# Patient Record
Sex: Female | Born: 1955 | Race: White | Hispanic: No | Marital: Single | Smoking: Never smoker
Health system: Southern US, Community
[De-identification: ages and names within clinical notes are randomized; demographics above are authoritative.]

## PROBLEM LIST (undated history)

## (undated) DIAGNOSIS — K219 Gastro-esophageal reflux disease without esophagitis: Secondary | ICD-10-CM

## (undated) DIAGNOSIS — E039 Hypothyroidism, unspecified: Secondary | ICD-10-CM

## (undated) DIAGNOSIS — F419 Anxiety disorder, unspecified: Secondary | ICD-10-CM

## (undated) HISTORY — PX: ABDOMINAL HYSTERECTOMY: SHX81

## (undated) HISTORY — PX: BACK SURGERY: SHX140

## (undated) HISTORY — PX: BIOPSY THYROID: PRO38

## (undated) HISTORY — PX: MENISCECTOMY: SHX123

---

## 2006-08-08 HISTORY — PX: BREAST SURGERY: SHX581

## 2008-11-01 ENCOUNTER — Ambulatory Visit: Payer: Self-pay

## 2009-04-19 ENCOUNTER — Ambulatory Visit: Payer: Self-pay | Admitting: Family Medicine

## 2011-07-06 ENCOUNTER — Ambulatory Visit: Payer: Self-pay

## 2011-07-20 ENCOUNTER — Ambulatory Visit: Payer: Self-pay

## 2011-08-04 ENCOUNTER — Ambulatory Visit: Payer: Self-pay

## 2013-01-15 ENCOUNTER — Ambulatory Visit: Payer: Self-pay | Admitting: Family Medicine

## 2014-05-04 ENCOUNTER — Ambulatory Visit: Payer: Self-pay | Admitting: Physician Assistant

## 2014-05-04 LAB — RAPID STREP-A WITH REFLX: MICRO TEXT REPORT: NEGATIVE

## 2014-05-07 LAB — BETA STREP CULTURE(ARMC)

## 2014-05-09 ENCOUNTER — Ambulatory Visit: Payer: Self-pay | Admitting: Family Medicine

## 2014-08-24 ENCOUNTER — Ambulatory Visit: Payer: Self-pay

## 2014-10-03 ENCOUNTER — Ambulatory Visit: Payer: Self-pay | Admitting: Family Medicine

## 2018-03-07 ENCOUNTER — Ambulatory Visit
Admission: RE | Admit: 2018-03-07 | Discharge: 2018-03-07 | Disposition: A | Discharge status: Home / Self Care | Payer: Managed Care, Other (non HMO) | Source: Ambulatory Visit | Attending: Orthopedic Surgery | Admitting: Orthopedic Surgery

## 2018-03-07 ENCOUNTER — Other Ambulatory Visit: Payer: Self-pay | Admitting: Orthopedic Surgery

## 2018-03-07 DIAGNOSIS — M25561 Pain in right knee: Principal | ICD-10-CM

## 2018-03-07 DIAGNOSIS — G8929 Other chronic pain: Secondary | ICD-10-CM | POA: Insufficient documentation

## 2018-03-28 ENCOUNTER — Encounter: Payer: Self-pay | Admitting: *Deleted

## 2018-03-28 ENCOUNTER — Other Ambulatory Visit: Payer: Self-pay

## 2018-03-28 ENCOUNTER — Encounter
Admission: RE | Admit: 2018-03-28 | Discharge: 2018-03-28 | Disposition: A | Discharge status: Home / Self Care | Payer: Managed Care, Other (non HMO) | Source: Ambulatory Visit | Attending: Orthopedic Surgery | Admitting: Orthopedic Surgery

## 2018-03-28 DIAGNOSIS — R9431 Abnormal electrocardiogram [ECG] [EKG]: Secondary | ICD-10-CM | POA: Insufficient documentation

## 2018-03-28 DIAGNOSIS — Z01812 Encounter for preprocedural laboratory examination: Secondary | ICD-10-CM | POA: Diagnosis present

## 2018-03-28 DIAGNOSIS — Z01810 Encounter for preprocedural cardiovascular examination: Secondary | ICD-10-CM | POA: Diagnosis present

## 2018-03-28 HISTORY — DX: Gastro-esophageal reflux disease without esophagitis: K21.9

## 2018-03-28 HISTORY — DX: Anxiety disorder, unspecified: F41.9

## 2018-03-28 HISTORY — DX: Hypothyroidism, unspecified: E03.9

## 2018-03-28 LAB — TYPE AND SCREEN
ABO/RH(D): O POS
Antibody Screen: NEGATIVE

## 2018-03-28 LAB — BASIC METABOLIC PANEL
ANION GAP: 8 (ref 5–15)
BUN: 19 mg/dL (ref 8–23)
CALCIUM: 9.1 mg/dL (ref 8.9–10.3)
CO2: 30 mmol/L (ref 22–32)
Chloride: 104 mmol/L (ref 98–111)
Creatinine, Ser: 0.91 mg/dL (ref 0.44–1.00)
GFR calc non Af Amer: >60 mL/min (ref >60)
Glucose, Bld: 88 mg/dL (ref 70–99)
Potassium: 3.7 mmol/L (ref 3.5–5.1)
Sodium: 142 mmol/L (ref 135–145)

## 2018-03-28 LAB — CBC
HCT: 43.1 % (ref 35.0–47.0)
Hemoglobin: 14.9 g/dL (ref 12.0–16.0)
MCH: 31.8 pg (ref 26.0–34.0)
MCHC: 34.5 g/dL (ref 32.0–36.0)
MCV: 92.3 fL (ref 80.0–100.0)
Platelets: 232 10*3/uL (ref 150–440)
RBC: 4.68 MIL/uL (ref 3.80–5.20)
RDW: 13.8 % (ref 11.5–14.5)
WBC: 6.5 10*3/uL (ref 3.6–11.0)

## 2018-03-28 LAB — APTT: APTT: 32 s (ref 24–36)

## 2018-03-28 LAB — SEDIMENTATION RATE: Sed Rate: 3 mm/hr (ref 0–30)

## 2018-03-28 LAB — SURGICAL PCR SCREEN
MRSA, PCR: NEGATIVE
Staphylococcus aureus: POSITIVE — AB

## 2018-03-28 LAB — PROTIME-INR
INR: 1.02
PROTHROMBIN TIME: 13.3 s (ref 11.4–15.2)

## 2018-03-28 NOTE — Patient Instructions (Signed)
Your procedure is scheduled on: SEPT 3, 2019 Tuesday  Report to Day Surgery on the 2nd floor of [facility]. To find out your arrival time, please call [phone]) [phone] between 1PM - 3PM on: SEPT 2, 2019 MONDAY  REMEMBER: Instructions that are not followed completely may result in serious medical risk, up to and including death; or upon the discretion of your surgeon and anesthesiologist your surgery may need to be rescheduled.  Do not eat food after midnight the night before surgery.  No gum chewing, lozengers or hard candies.  You may however, drink CLEAR liquids up to 2 hours before you are scheduled to arrive for your surgery. Do not drink anything within 2 hours of the start of your surgery.  Clear liquids include: - water  - apple juice without pulp - gatorade - black coffee or tea (Do NOT add milk or creamers to the coffee or tea) Do NOT drink anything that is not on this list.  No Alcohol for 24 hours before or after surgery.  No Smoking including e-cigarettes for 24 hours prior to surgery.  No chewable tobacco products for at least 6 hours prior to surgery.  No nicotine patches on the day of surgery.  On the morning of surgery brush your teeth with toothpaste and water, you may rinse your mouth with mouthwash if you wish. Do not swallow any toothpaste or mouthwash.  Notify your doctor if there is any change in your medical condition (cold, fever, infection).  Do not wear jewelry, make-up, hairpins, clips or nail polish.  Do not wear lotions, powders, or perfumes. You may wear deodorant.  Do not shave 48 hours prior to surgery. Men may shave face and neck.  Contacts and dentures may not be worn into surgery.  Do not bring valuables to the hospital, including drivers license, insurance or credit cards.  [facility] is not responsible for any belongings or valuables.   TAKE THESE MEDICATIONS THE MORNING OF SURGERY: LEVOTHYROXINE FLUOXETINE NEXIUM TAKE A DOSE THE  NIGHT BEFORE AND THE MORNING OF SURGERY  Use CHG Soap or wipes as directed on instruction sheet.  Stop Anti-inflammatories (NSAIDS) such as Advil, Aleve, Ibuprofen, Motrin, Naproxen, Naprosyn and Aspirin based products such as Excedrin, Goodys Powder, BC Powder 7 DAYS BEFORE SURGERY (May take Tylenol or Acetaminophen if needed.)  Stop ANY OVER THE COUNTER supplements until after surgery. (May continue Vitamin D, Vitamin B, and multivitamin.)  Wear comfortable clothing (specific to your surgery type) to the hospital.  Plan for stool softeners for home use.  If you are being admitted to the hospital overnight, leave your suitcase in the car. After surgery it may be brought to your room.  If you are being discharged the day of surgery, you will not be allowed to drive home. You will need a responsible adult to drive you home and stay with you that night.   If you are taking public transportation, you will need to have a responsible adult with you. Please confirm with your physician that it is acceptable to use public transportation.   Please call [phone] if you have any questions about these instructions.

## 2018-03-29 NOTE — Pre-Procedure Instructions (Signed)
Positive staph aureus results sent to Dr. Rosita KeaMenz for review.  Asked if wanted any treatment &/or antibiotic?

## 2018-03-30 LAB — URINE CULTURE: Culture: <10000 — AB

## 2018-04-02 NOTE — Pre-Procedure Instructions (Signed)
UA RESULTS NOT IN Epic. UC INSIGNIFICANT GROWTH. [facility] UA

## 2018-04-09 MED ORDER — CEFAZOLIN SODIUM-DEXTROSE 2-4 GM/100ML-% IV SOLN
2.0000 g | Freq: Once | INTRAVENOUS | Status: AC
  Administered 2018-04-10: 2 g via INTRAVENOUS

## 2018-04-10 ENCOUNTER — Inpatient Hospital Stay: Payer: Managed Care, Other (non HMO)

## 2018-04-10 ENCOUNTER — Encounter: Payer: Self-pay | Admitting: *Deleted

## 2018-04-10 ENCOUNTER — Inpatient Hospital Stay: Payer: Managed Care, Other (non HMO) | Admitting: Anesthesiology

## 2018-04-10 ENCOUNTER — Other Ambulatory Visit: Payer: Self-pay

## 2018-04-10 ENCOUNTER — Inpatient Hospital Stay
Admission: RE | Admit: 2018-04-10 | Discharge: 2018-04-13 | DRG: 470 | Disposition: A | Discharge status: Skilled Nursing Facility | Payer: Managed Care, Other (non HMO) | Source: Ambulatory Visit | Attending: Orthopedic Surgery | Admitting: Orthopedic Surgery

## 2018-04-10 ENCOUNTER — Encounter
Admission: RE | Disposition: A | Discharge status: Skilled Nursing Facility | Payer: Self-pay | Source: Ambulatory Visit | Attending: Orthopedic Surgery

## 2018-04-10 DIAGNOSIS — F329 Major depressive disorder, single episode, unspecified: Secondary | ICD-10-CM | POA: Diagnosis present

## 2018-04-10 DIAGNOSIS — T391X5A Adverse effect of 4-Aminophenol derivatives, initial encounter: Secondary | ICD-10-CM | POA: Diagnosis not present

## 2018-04-10 DIAGNOSIS — F419 Anxiety disorder, unspecified: Secondary | ICD-10-CM | POA: Diagnosis present

## 2018-04-10 DIAGNOSIS — E039 Hypothyroidism, unspecified: Secondary | ICD-10-CM | POA: Diagnosis present

## 2018-04-10 DIAGNOSIS — R112 Nausea with vomiting, unspecified: Secondary | ICD-10-CM | POA: Diagnosis not present

## 2018-04-10 DIAGNOSIS — M1711 Unilateral primary osteoarthritis, right knee: Principal | ICD-10-CM | POA: Diagnosis present

## 2018-04-10 DIAGNOSIS — K219 Gastro-esophageal reflux disease without esophagitis: Secondary | ICD-10-CM | POA: Diagnosis present

## 2018-04-10 DIAGNOSIS — G8918 Other acute postprocedural pain: Secondary | ICD-10-CM

## 2018-04-10 DIAGNOSIS — Z90711 Acquired absence of uterus with remaining cervical stump: Secondary | ICD-10-CM | POA: Diagnosis not present

## 2018-04-10 DIAGNOSIS — Z96651 Presence of right artificial knee joint: Secondary | ICD-10-CM

## 2018-04-10 HISTORY — PX: TOTAL KNEE ARTHROPLASTY: SHX125

## 2018-04-10 LAB — ABO/RH: ABO/RH(D): O POS

## 2018-04-10 SURGERY — ARTHROPLASTY, KNEE, TOTAL
Anesthesia: Spinal | Laterality: Right

## 2018-04-10 MED ORDER — BUPIVACAINE LIPOSOME 1.3 % IJ SUSP
INTRAMUSCULAR | Status: AC
  Filled 2018-04-10: qty 20

## 2018-04-10 MED ORDER — OXYCODONE HCL 5 MG PO TABS: 5.0000 mg | ORAL_TABLET | Freq: Once | ORAL | Status: DC | PRN

## 2018-04-10 MED ORDER — FENTANYL CITRATE (PF) 100 MCG/2ML IJ SOLN: 25.0000 ug | INTRAMUSCULAR | Status: DC | PRN

## 2018-04-10 MED ORDER — MORPHINE SULFATE 10 MG/ML IJ SOLN
INTRAMUSCULAR | Status: DC | PRN
  Administered 2018-04-10: 10 mg via INTRAVENOUS

## 2018-04-10 MED ORDER — SODIUM CHLORIDE 0.9 % IJ SOLN
INTRAMUSCULAR | Status: AC
  Filled 2018-04-10: qty 100

## 2018-04-10 MED ORDER — METHOCARBAMOL 500 MG PO TABS
500.0000 mg | ORAL_TABLET | Freq: Four times a day (QID) | ORAL | Status: DC | PRN
  Administered 2018-04-10 – 2018-04-11 (×3): 500 mg via ORAL
  Filled 2018-04-10 (×4): qty 1

## 2018-04-10 MED ORDER — SODIUM CHLORIDE 0.9 % IV SOLN
INTRAVENOUS | Status: DC | PRN
  Administered 2018-04-10: 35 ug/min via INTRAVENOUS

## 2018-04-10 MED ORDER — PROPOFOL 10 MG/ML IV BOLUS
INTRAVENOUS | Status: DC | PRN
  Administered 2018-04-10: 40 mg via INTRAVENOUS

## 2018-04-10 MED ORDER — METOCLOPRAMIDE HCL 5 MG/ML IJ SOLN: 5.0000 mg | Freq: Three times a day (TID) | INTRAMUSCULAR | Status: DC | PRN

## 2018-04-10 MED ORDER — METHOCARBAMOL 1000 MG/10ML IJ SOLN
500.0000 mg | Freq: Four times a day (QID) | INTRAVENOUS | Status: DC | PRN
  Filled 2018-04-10: qty 5

## 2018-04-10 MED ORDER — ONDANSETRON HCL 4 MG/2ML IJ SOLN
4.0000 mg | Freq: Four times a day (QID) | INTRAMUSCULAR | Status: DC | PRN
  Administered 2018-04-10: 4 mg via INTRAVENOUS
  Filled 2018-04-10: qty 2

## 2018-04-10 MED ORDER — PROPOFOL 500 MG/50ML IV EMUL
INTRAVENOUS | Status: DC | PRN
  Administered 2018-04-10: 100 ug/kg/min via INTRAVENOUS

## 2018-04-10 MED ORDER — TRANEXAMIC ACID 1000 MG/10ML IV SOLN
1000.0000 mg | INTRAVENOUS | Status: AC
  Administered 2018-04-10: 1000 mg via INTRAVENOUS
  Filled 2018-04-10: qty 1000

## 2018-04-10 MED ORDER — SODIUM CHLORIDE 0.9 % IV SOLN
INTRAVENOUS | Status: DC | PRN
  Administered 2018-04-10: 60 mL

## 2018-04-10 MED ORDER — ACETAMINOPHEN 500 MG PO TABS
500.0000 mg | ORAL_TABLET | Freq: Four times a day (QID) | ORAL | Status: AC
  Administered 2018-04-10 – 2018-04-11 (×4): 500 mg via ORAL
  Filled 2018-04-10 (×4): qty 1

## 2018-04-10 MED ORDER — ASPIRIN EC 325 MG PO TBEC
325.0000 mg | DELAYED_RELEASE_TABLET | Freq: Every day | ORAL | Status: DC
  Administered 2018-04-11 – 2018-04-13 (×3): 325 mg via ORAL
  Filled 2018-04-10 (×3): qty 1

## 2018-04-10 MED ORDER — GABAPENTIN 300 MG PO CAPS
300.0000 mg | ORAL_CAPSULE | Freq: Three times a day (TID) | ORAL | Status: DC
  Administered 2018-04-10 – 2018-04-13 (×9): 300 mg via ORAL
  Filled 2018-04-10 (×10): qty 1

## 2018-04-10 MED ORDER — MAGNESIUM CITRATE PO SOLN
1.0000 | Freq: Once | ORAL | Status: DC | PRN
  Filled 2018-04-10: qty 296

## 2018-04-10 MED ORDER — METOCLOPRAMIDE HCL 10 MG PO TABS: 5.0000 mg | ORAL_TABLET | Freq: Three times a day (TID) | ORAL | Status: DC | PRN

## 2018-04-10 MED ORDER — LACTATED RINGERS IV SOLN
INTRAVENOUS | Status: DC
  Administered 2018-04-10 (×2): via INTRAVENOUS

## 2018-04-10 MED ORDER — NEOMYCIN-POLYMYXIN B GU 40-200000 IR SOLN
Status: DC | PRN
  Administered 2018-04-10: 14 mL

## 2018-04-10 MED ORDER — PROPOFOL 500 MG/50ML IV EMUL
INTRAVENOUS | Status: AC
  Filled 2018-04-10: qty 50

## 2018-04-10 MED ORDER — MIDAZOLAM HCL 2 MG/2ML IJ SOLN
INTRAMUSCULAR | Status: AC
  Filled 2018-04-10: qty 2

## 2018-04-10 MED ORDER — PHENOL 1.4 % MT LIQD
1.0000 | OROMUCOSAL | Status: DC | PRN
  Filled 2018-04-10: qty 177

## 2018-04-10 MED ORDER — MENTHOL 3 MG MT LOZG
1.0000 | LOZENGE | OROMUCOSAL | Status: DC | PRN
  Filled 2018-04-10: qty 9

## 2018-04-10 MED ORDER — BUPIVACAINE-EPINEPHRINE (PF) 0.25% -1:200000 IJ SOLN
INTRAMUSCULAR | Status: DC | PRN
  Administered 2018-04-10: 30 mL via PERINEURAL

## 2018-04-10 MED ORDER — CEFAZOLIN SODIUM-DEXTROSE 2-4 GM/100ML-% IV SOLN
INTRAVENOUS | Status: AC
  Filled 2018-04-10: qty 100

## 2018-04-10 MED ORDER — BUPIVACAINE-EPINEPHRINE (PF) 0.25% -1:200000 IJ SOLN
INTRAMUSCULAR | Status: AC
  Filled 2018-04-10: qty 30

## 2018-04-10 MED ORDER — SODIUM CHLORIDE 0.9 % IV SOLN
INTRAVENOUS | Status: DC
  Administered 2018-04-10 – 2018-04-11 (×2): via INTRAVENOUS

## 2018-04-10 MED ORDER — KETOROLAC TROMETHAMINE 30 MG/ML IJ SOLN
INTRAMUSCULAR | Status: DC | PRN
  Administered 2018-04-10: 30 mg via INTRAVENOUS

## 2018-04-10 MED ORDER — ONDANSETRON HCL 4 MG PO TABS: 4.0000 mg | ORAL_TABLET | Freq: Four times a day (QID) | ORAL | Status: DC | PRN

## 2018-04-10 MED ORDER — HYDROCODONE-ACETAMINOPHEN 7.5-325 MG PO TABS
1.0000 | ORAL_TABLET | ORAL | Status: DC | PRN
  Administered 2018-04-11 (×2): 2 via ORAL
  Filled 2018-04-10 (×2): qty 2

## 2018-04-10 MED ORDER — FLUOXETINE HCL 20 MG PO CAPS
40.0000 mg | ORAL_CAPSULE | Freq: Every day | ORAL | Status: DC
  Administered 2018-04-10 – 2018-04-13 (×4): 40 mg via ORAL
  Filled 2018-04-10 (×4): qty 2

## 2018-04-10 MED ORDER — CEFAZOLIN SODIUM-DEXTROSE 2-4 GM/100ML-% IV SOLN
2.0000 g | Freq: Four times a day (QID) | INTRAVENOUS | Status: AC
  Administered 2018-04-10 (×2): 2 g via INTRAVENOUS
  Filled 2018-04-10 (×3): qty 100

## 2018-04-10 MED ORDER — MIDAZOLAM HCL 5 MG/5ML IJ SOLN
INTRAMUSCULAR | Status: DC | PRN
  Administered 2018-04-10: 2 mg via INTRAVENOUS

## 2018-04-10 MED ORDER — LEVOTHYROXINE SODIUM 25 MCG PO TABS: 25.0000 ug | ORAL_TABLET | ORAL | Status: DC

## 2018-04-10 MED ORDER — PANTOPRAZOLE SODIUM 40 MG PO TBEC
40.0000 mg | DELAYED_RELEASE_TABLET | Freq: Every day | ORAL | Status: DC
  Administered 2018-04-11 – 2018-04-12 (×2): 40 mg via ORAL
  Filled 2018-04-10 (×2): qty 1

## 2018-04-10 MED ORDER — TRAMADOL HCL 50 MG PO TABS
50.0000 mg | ORAL_TABLET | Freq: Four times a day (QID) | ORAL | Status: DC
  Administered 2018-04-10 – 2018-04-13 (×12): 50 mg via ORAL
  Filled 2018-04-10 (×12): qty 1

## 2018-04-10 MED ORDER — LORATADINE 10 MG PO TABS: 10.0000 mg | ORAL_TABLET | Freq: Every day | ORAL | Status: DC | PRN

## 2018-04-10 MED ORDER — SENNOSIDES-DOCUSATE SODIUM 8.6-50 MG PO TABS
1.0000 | ORAL_TABLET | Freq: Every evening | ORAL | Status: DC | PRN
  Administered 2018-04-12: 1 via ORAL
  Filled 2018-04-10: qty 1

## 2018-04-10 MED ORDER — LEVOTHYROXINE SODIUM 50 MCG PO TABS
50.0000 ug | ORAL_TABLET | ORAL | Status: DC
  Administered 2018-04-11 – 2018-04-13 (×3): 50 ug via ORAL
  Filled 2018-04-10 (×3): qty 1

## 2018-04-10 MED ORDER — ACETAMINOPHEN 325 MG PO TABS: 325.0000 mg | ORAL_TABLET | Freq: Four times a day (QID) | ORAL | Status: DC | PRN

## 2018-04-10 MED ORDER — BISACODYL 5 MG PO TBEC
5.0000 mg | DELAYED_RELEASE_TABLET | Freq: Every day | ORAL | Status: DC | PRN
  Administered 2018-04-11: 5 mg via ORAL
  Filled 2018-04-10: qty 1

## 2018-04-10 MED ORDER — ZOLPIDEM TARTRATE 5 MG PO TABS: 5.0000 mg | ORAL_TABLET | Freq: Every evening | ORAL | Status: DC | PRN

## 2018-04-10 MED ORDER — MORPHINE SULFATE (PF) 2 MG/ML IV SOLN
0.5000 mg | INTRAVENOUS | Status: DC | PRN
  Administered 2018-04-10: 1 mg via INTRAVENOUS
  Administered 2018-04-10 (×2): 0.5 mg via INTRAVENOUS
  Administered 2018-04-11 (×2): 1 mg via INTRAVENOUS
  Filled 2018-04-10 (×5): qty 1

## 2018-04-10 MED ORDER — ADULT MULTIVITAMIN W/MINERALS CH
1.0000 | ORAL_TABLET | Freq: Every day | ORAL | Status: DC
  Administered 2018-04-10 – 2018-04-13 (×4): 1 via ORAL
  Filled 2018-04-10 (×4): qty 1

## 2018-04-10 MED ORDER — OXYCODONE HCL 5 MG/5ML PO SOLN: 5.0000 mg | Freq: Once | ORAL | Status: DC | PRN

## 2018-04-10 MED ORDER — ALUM & MAG HYDROXIDE-SIMETH 200-200-20 MG/5ML PO SUSP: 30.0000 mL | ORAL | Status: DC | PRN

## 2018-04-10 MED ORDER — HYDROCODONE-ACETAMINOPHEN 5-325 MG PO TABS
1.0000 | ORAL_TABLET | ORAL | Status: DC | PRN
  Administered 2018-04-10: 2 via ORAL
  Administered 2018-04-11: 1 via ORAL
  Administered 2018-04-11: 2 via ORAL
  Administered 2018-04-12: 1 via ORAL
  Administered 2018-04-12 – 2018-04-13 (×2): 2 via ORAL
  Filled 2018-04-10 (×5): qty 2
  Filled 2018-04-10 (×2): qty 1

## 2018-04-10 MED ORDER — DIPHENHYDRAMINE HCL 12.5 MG/5ML PO ELIX: 12.5000 mg | ORAL_SOLUTION | ORAL | Status: DC | PRN

## 2018-04-10 MED ORDER — BUPIVACAINE HCL (PF) 0.5 % IJ SOLN
INTRAMUSCULAR | Status: DC | PRN
  Administered 2018-04-10: 3 mL

## 2018-04-10 MED ORDER — DOCUSATE SODIUM 100 MG PO CAPS
100.0000 mg | ORAL_CAPSULE | Freq: Two times a day (BID) | ORAL | Status: DC
  Administered 2018-04-10 – 2018-04-12 (×5): 100 mg via ORAL
  Filled 2018-04-10 (×6): qty 1

## 2018-04-10 MED ORDER — MORPHINE SULFATE (PF) 10 MG/ML IV SOLN
INTRAVENOUS | Status: AC
  Filled 2018-04-10: qty 1

## 2018-04-10 MED ORDER — NEOMYCIN-POLYMYXIN B GU 40-200000 IR SOLN
Status: AC
  Filled 2018-04-10: qty 20

## 2018-04-10 SURGICAL SUPPLY — 65 items
BANDAGE ACE 6X5 VEL STRL LF (GAUZE/BANDAGES/DRESSINGS) ×3 IMPLANT
BLADE SAW 1 (BLADE) ×3 IMPLANT
BLOCK CUTTING FEMUR 3 RT MED (MISCELLANEOUS) ×3 IMPLANT
BLOCK CUTTING TIBIAL 4 2 R ×3 IMPLANT
CANISTER SUCT 1200ML W/VALVE (MISCELLANEOUS) ×3 IMPLANT
CANISTER SUCT 3000ML PPV (MISCELLANEOUS) ×6 IMPLANT
CEMENT HV SMART SET (Cement) ×6 IMPLANT
CEMENT PATELLA RESURF SZ1 (Cement) ×3 IMPLANT
CHLORAPREP W/TINT 26ML (MISCELLANEOUS) ×6 IMPLANT
COOLER POLAR GLACIER W/PUMP (MISCELLANEOUS) ×3 IMPLANT
CUFF TOURN 24 STER (MISCELLANEOUS) IMPLANT
CUFF TOURN 30 STER DUAL PORT (MISCELLANEOUS) IMPLANT
DRAPE SHEET LG 3/4 BI-LAMINATE (DRAPES) ×6 IMPLANT
ELECT CAUTERY BLADE 6.4 (BLADE) ×3 IMPLANT
ELECT REM PT RETURN 9FT ADLT (ELECTROSURGICAL) ×3
ELECTRODE REM PT RTRN 9FT ADLT (ELECTROSURGICAL) ×1 IMPLANT
FEMORAL COMPONENT PS R S3N (Femur) ×3 IMPLANT
FEMUR BONE MODEL (MISCELLANEOUS) ×3 IMPLANT
FEMUR BONE MODEL 4.9040 MEDACT (MISCELLANEOUS) ×3 IMPLANT
GAUZE PETRO XEROFOAM 1X8 (MISCELLANEOUS) ×3 IMPLANT
GAUZE SPONGE 4X4 12PLY STRL (GAUZE/BANDAGES/DRESSINGS) ×3 IMPLANT
GLOVE BIOGEL PI IND STRL 9 (GLOVE) ×1 IMPLANT
GLOVE BIOGEL PI INDICATOR 9 (GLOVE) ×2
GLOVE INDICATOR 8.0 STRL GRN (GLOVE) ×3 IMPLANT
GLOVE SURG ORTHO 8.0 STRL STRW (GLOVE) ×3 IMPLANT
GLOVE SURG SYN 9.0  PF PI (GLOVE) ×2
GLOVE SURG SYN 9.0 PF PI (GLOVE) ×1 IMPLANT
GOWN SRG 2XL LVL 4 RGLN SLV (GOWNS) ×1 IMPLANT
GOWN STRL NON-REIN 2XL LVL4 (GOWNS) ×2
GOWN STRL REUS W/ TWL LRG LVL3 (GOWN DISPOSABLE) ×1 IMPLANT
GOWN STRL REUS W/ TWL XL LVL3 (GOWN DISPOSABLE) ×1 IMPLANT
GOWN STRL REUS W/TWL LRG LVL3 (GOWN DISPOSABLE) ×2
GOWN STRL REUS W/TWL XL LVL3 (GOWN DISPOSABLE) ×2
HOLDER FOLEY CATH W/STRAP (MISCELLANEOUS) ×3 IMPLANT
HOOD PEEL AWAY FLYTE STAYCOOL (MISCELLANEOUS) ×6 IMPLANT
KIT TURNOVER KIT A (KITS) ×3 IMPLANT
KNIFE SCULPS 14X20 (INSTRUMENTS) ×3 IMPLANT
NDL SAFETY ECLIPSE 18X1.5 (NEEDLE) ×1 IMPLANT
NEEDLE HYPO 18GX1.5 SHARP (NEEDLE) ×2
NEEDLE SPNL 18GX3.5 QUINCKE PK (NEEDLE) ×3 IMPLANT
NEEDLE SPNL 20GX3.5 QUINCKE YW (NEEDLE) ×3 IMPLANT
NS IRRIG 1000ML POUR BTL (IV SOLUTION) ×3 IMPLANT
PACK TOTAL KNEE (MISCELLANEOUS) ×3 IMPLANT
PAD WRAPON POLAR KNEE (MISCELLANEOUS) ×1 IMPLANT
PREVENA INCISION MGT 90 150 (MISCELLANEOUS) ×3 IMPLANT
PULSAVAC PLUS IRRIG FAN TIP (DISPOSABLE) ×3
SCALPEL PROTECTED #10 DISP (BLADE) ×6 IMPLANT
SOL .9 NS 3000ML IRR  AL (IV SOLUTION) ×2
SOL .9 NS 3000ML IRR UROMATIC (IV SOLUTION) ×1 IMPLANT
STAPLER SKIN PROX 35W (STAPLE) ×3 IMPLANT
STEM EXTENSION 11MMX30MM (Stem) ×3 IMPLANT
SUCTION FRAZIER HANDLE 10FR (MISCELLANEOUS) ×2
SUCTION TUBE FRAZIER 10FR DISP (MISCELLANEOUS) ×1 IMPLANT
SUT DVC 2 QUILL PDO  T11 36X36 (SUTURE) ×2
SUT DVC 2 QUILL PDO T11 36X36 (SUTURE) ×1 IMPLANT
SUT V-LOC 90 ABS DVC 3-0 CL (SUTURE) ×3 IMPLANT
SYR 20CC LL (SYRINGE) ×3 IMPLANT
SYR 50ML LL SCALE MARK (SYRINGE) ×6 IMPLANT
TIB INSERT FIXED SZ2 14MM 0207 (Insert) ×3 IMPLANT
TIB TRAY SZ 2 R FIXED (Joint) ×3 IMPLANT
TIP FAN IRRIG PULSAVAC PLUS (DISPOSABLE) ×1 IMPLANT
TOWEL OR 17X26 4PK STRL BLUE (TOWEL DISPOSABLE) ×3 IMPLANT
TOWER CARTRIDGE SMART MIX (DISPOSABLE) ×3 IMPLANT
TRAY FOLEY MTR SLVR 16FR STAT (SET/KITS/TRAYS/PACK) ×3 IMPLANT
WRAPON POLAR PAD KNEE (MISCELLANEOUS) ×3

## 2018-04-10 NOTE — Progress Notes (Signed)
Spoke with Dr. Sampson Goon about patients spinal progress. Patient has had slight spinal regression, patient's sensory level is between L1-L2, patient able to flex quadriceps muscle. Dr. Sampson Goon said it's okay to go to the floor as long as spinal regression is taking place.

## 2018-04-10 NOTE — Progress Notes (Signed)
Physical Therapy Evaluation Patient Details Name: Caitlin Mendez MRN: [id number] DOB: 11-Nov-1955 Today's Date: 04/10/2018   History of Present Illness  Pt underwent R TKR without reported post-op complications. PMH includes depression, GERD, low B12, and previous R knee surgery. PT evaluation performed on POD#0.  Clinical Impression  Pt admitted with above diagnosis. Pt currently with functional limitations due to the deficits listed below (see PT Problem List).  Pt is modified independent with bed mobility and CGA only for transfers and ambulation. Pt able to take short, shuffling steps to ambulate from bed to recliner. Cues for proper sequencing with rolling walker. Pt is stable with use of walker. AAROM is -3 to 99 degrees and limited in flexion by pain. Pt will benefit from PT services to address deficits in strength, balance, and mobility in order to return to full function at home.      Follow Up Recommendations Home health PT    Equipment Recommendations  Rolling walker with 5" wheels    Recommendations for Other Services       Precautions / Restrictions Precautions Precautions: Knee Required Braces or Orthoses: Knee Immobilizer - Right Knee Immobilizer - Right: Discontinue once straight leg raise with < 10 degree lag Restrictions Weight Bearing Restrictions: Yes RLE Weight Bearing: Weight bearing as tolerated      Mobility  Bed Mobility Overal bed mobility: Modified Independent             General bed mobility comments: Increase in time and effort. Use of bed rails and HOB elevated  Transfers Overall transfer level: Needs assistance Equipment used: Rolling walker (2 wheeled) Transfers: Sit to/from Stand Sit to Stand: Min guard         General transfer comment: Pt able to perform sit to stand transfer with CGA only. Good stability in standing. Pt denies dizziness/lightheadedness. Orthostatic vitals are negative  Ambulation/Gait Ambulation/Gait  assistance: Min guard Gait Distance (Feet): 3 Feet Assistive device: Rolling walker (2 wheeled)       General Gait Details: Pt able to take short, shuffling steps to ambulate from bed to recliner. Cues for proper sequencing with rolling walker. Pt is stable with use of walker  Stairs            Wheelchair Mobility    Modified Rankin (Stroke Patients Only)       Balance Overall balance assessment: Needs assistance Sitting-balance support: No upper extremity supported Sitting balance-Leahy Scale: Good     Standing balance support: No upper extremity supported Standing balance-Leahy Scale: Fair                               Pertinent Vitals/Pain Pain Assessment: 0-10 Pain Score: 4  Pain Location: R knee Pain Descriptors / Indicators: Operative site guarding Pain Intervention(s): Monitored during session;Repositioned    Home Living Family/patient expects to be discharged to:: Private residence Living Arrangements: Alone Available Help at Discharge: Other (Comment)(Pt reports no family/friends available) Type of Home: House Home Access: Stairs to enter Entrance Stairs-Rails: None Entrance Stairs-Number of Steps: 5 Home Layout: One level Home Equipment: Walker - 4 wheels;Cane - single point      Prior Function Level of Independence: Independent with assistive device(s)         Comments: Independent with ADLs/IADLs. Intermittent use of single point cane. No falls     Hand Dominance   Dominant Hand: Right    Extremity/Trunk Assessment   Upper Extremity Assessment  Upper Extremity Assessment: Overall WFL for tasks assessed    Lower Extremity Assessment Lower Extremity Assessment: RLE deficits/detail RLE Deficits / Details: Pt reports mild residual numbness to RLE. Full DF/PF. Full SAQ and SLR without assistance       Communication   Communication: No difficulties  Cognition Arousal/Alertness: Awake/alert Behavior During Therapy: WFL  for tasks assessed/performed Overall Cognitive Status: Within Functional Limits for tasks assessed                                        General Comments      Exercises Total Joint Exercises Ankle Circles/Pumps: Both;10 reps Quad Sets: Both;10 reps Gluteal Sets: Both;10 reps Towel Squeeze: Both;10 reps Short Arc Quad: Right;10 reps Heel Slides: Right;10 reps Hip ABduction/ADduction: Right;10 reps Straight Leg Raises: Right;10 reps Goniometric ROM: -3 to 99 degrees AAROM, pain limited in flexion   Assessment/Plan    PT Assessment Patient needs continued PT services  PT Problem List Decreased strength;Decreased activity tolerance;Decreased balance;Decreased range of motion;Pain       PT Treatment Interventions DME instruction;Gait training;Stair training;Functional mobility training;Therapeutic activities;Therapeutic exercise;Balance training;Patient/family education;Manual techniques    PT Goals (Current goals can be found in the Care Plan section)  Acute Rehab PT Goals Patient Stated Goal: Return to prior level of function PT Goal Formulation: With patient Time For Goal Achievement: 04/24/18 Potential to Achieve Goals: Good    Frequency BID   Barriers to discharge Decreased caregiver support Pt lives alone and report no assistance at discharge    Co-evaluation               AM-PAC PT "6 Clicks" Daily Activity  Outcome Measure Difficulty turning over in bed (including adjusting bedclothes, sheets and blankets)?: A Little Difficulty moving from lying on back to sitting on the side of the bed? : A Little Difficulty sitting down on and standing up from a chair with arms (e.g., wheelchair, bedside commode, etc,.)?: A Little Help needed moving to and from a bed to chair (including a wheelchair)?: A Little Help needed walking in hospital room?: A Little Help needed climbing 3-5 steps with a railing? : A Little 6 Click Score: 18    End of Session  Equipment Utilized During Treatment: Gait belt Activity Tolerance: Patient tolerated treatment well Patient left: in chair;with call bell/phone within reach;with chair alarm set;with SCD's reapplied;Other (comment)(towel roll under heel, polar care in place) Nurse Communication: Mobility status PT Visit Diagnosis: Unsteadiness on feet (R26.81);Muscle weakness (generalized) (M62.81);Pain Pain - Right/Left: Right Pain - part of body: Knee    Time: 0981-1914 PT Time Calculation (min) (ACUTE ONLY): 33 min   Charges:   PT Evaluation $PT Eval Low Complexity: 1 Low PT Treatments $Therapeutic Exercise: 8-22 mins        Sharalyn Ink Devanny Palecek PT, DPT, GCS   Elajah Kunsman 04/10/2018, 5:34 PM

## 2018-04-10 NOTE — H&P (Signed)
Reviewed paper H+P, will be scanned into chart. No changes noted.  

## 2018-04-10 NOTE — H&P (Signed)
Caitlin Mendez, Caitlin Mendez Formatting of this note might be different from the original. Chief Complaint  Patient presents with  . pre op work up  RT TKA09/03/19 [facility]   History of Present Illness: Caitlin Mendez is a 62 y.o. female who is here today for history and physical for right total knee arthroplasty with Dr. Rosita Kea on 04/10/2018. She has had 2 arthroscopy procedures in the past in 2016 in [place]. X-rays show severe degenerative changes in lateral compartment. She is had no relief with hyaluronic acid, cortisone injections as well as physical therapy. Patient is seen Dr. Rosita Kea, and discussed risk benefits complications of total knee replacement and would like to like to proceed with right total knee arthroplasty. Patient's pain is constant, severe and located along the lateral aspect of the knee. She has a hard time walking upstairs. She uses a cane for assistance. She tries to take over-the-counter anti-inflammatory medications for pain. She has taken narcotics in the past for knee pain but does not like taking these medications. Pain is to the point where it interferes with her activities of daily living.  Past Medical History: Past Medical History:  Diagnosis Date  . Allergic rhinitis  . Depressive disorder  . GERD (gastroesophageal reflux disease)  . Low vitamin B12 level  . 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 bx lt mass, reassess 6 months -04/2012: pt declines u/s and endo referral CT neck DDI 09/2010: no pathological left ant cervical LAD, tiny left thyroid nodules  . Right knee meniscal tear 05/07/2015  MRI [facility] 04/2015: complex lateral meniscus with medial meniscal extrusion, cartilage abnormalities worse in patellofemoral compartment, small joint effusion, popliteal cyst   Past Surgical  History: Past Surgical History:  Procedure Laterality Date  . Arthroscopic Partial Lateral Menisectomy Right 10/09/2014  [facility]  . COLONOSCOPY 11/2007  [facility], benign polyps  . knee arthroscopic lateral meniscal debridement Right 12/29/2016  [facility]  . LUMBAR DISC SURGERY 1992  . PARTIAL HYSTERECTOMY Age 45  . Right Breast Core Biopsy 05/2003  benign  . THERAPEUTIC ABORTION   Past Family History: Family History  Problem Relation Age of Onset  . High blood pressure (Hypertension) Mother  . Atrial fibrillation (Abnormal heart rhythm sometimes requiring treatment with blood thinners) Mother  . Benign prostatic hyperplasia Father  . Macular degeneration Father  . OCD Sister  . Scleroderma Sister  . Colon cancer Maternal Grandmother  . Alzheimer's disease Maternal Grandfather  . Stomach cancer Paternal Grandfather  . Lung cancer Maternal Aunt   Medications: Current Outpatient Medications Ordered in Epic  Medication Sig Dispense Refill  . esomeprazole (NEXIUM) 40 MG DR capsule 1 cap by mouth daily 1  . fexofenadine (ALLEGRA) 180 MG tablet Take by mouth Take 180 mg by mouth daily as needed for allergies or rhinitis.  Marland Kitchen FLUoxetine (PROZAC) 40 MG capsule Take 40 mg by mouth once daily.   Marland Kitchen ibuprofen-famotidine 800-26.6 mg Tab Take 1 tablet by mouth.   . levothyroxine sodium (LEVOTHYROXINE ORAL) Take by mouth  . multivitamin tablet Take 1 tablet by mouth once daily.   . multivitamin with iron-minerals (VITAMINS AND MINERALS) tablet Take by mouth Take 1 tablet by mouth daily.  . naproxen sodium (ALEVE, ANAPROX) 220 MG tablet Take by mouth Take 440 mg by mouth 2 (two) times daily as needed (knee pain).  Marland Kitchen  clotrimazole-betamethasone (LOTRISONE) 1-0.05 % cream use daily x 7 days to perineum (Patient not taking: Reported on 04/02/2018 ) 1  . meloxicam (MOBIC) 15 MG tablet Take 15 mg by mouth once daily.   . polyvinyl alcohol (POLYVINYL ALCOHOL) 1.4 % ophthalmic solution   No  current Epic-ordered facility-administered medications on file.   Allergies: Allergies  Allergen Reactions  . Others Hives  Uncoded Allergy. Allergen: DILATIN  . Septra Ds [Sulfamethoxazole-Trimethoprim] Hives  . Phenytoin Sodium Hives  . Sulfa (Sulfonamide Antibiotics) Other (See Comments)  UNKNOWN  . Sulfamethoxazole Hives  . Trimethoprim Hives  . Phenytoin Sodium Extended Rash    Body mass index is 35.94 kg/m.  Review of Systems:  A comprehensive 14 point ROS was performed, reviewed, and the pertinent orthopaedic findings are documented in the HPI.  Physical Exam: General:  Well developed, well nourished, no apparent distress, normal affect, mild antalgic gait.  HEENT: Head normocephalic, atraumatic, PERRL.   Abdomen: Soft, non tender, non distended, Bowel sounds present.  Heart: Examination of the heart reveals regular, rate, and rhythm. There is no murmur noted on ascultation. There is a normal apical pulse.  Lungs: Lungs are clear to auscultation. There is no wheeze, rhonchi, or crackles. There is normal expansion of bilateral chest walls.   Vitals:  04/02/18 1104  BP: 110/70   Orthopaedic Examination: Right Left   Gait Normal  Alignment Valgus deformity of right knee that does not correct  Inspection Normal Normal  Palpation Knee Large Baker's cyst. Normal  Range of Motion Knee 15 degree flexion contracture. 115 degrees of flexion. Normal  Strength Normal Normal  Meniscus Exam Normal Normal  Ligament Exam Normal Normal  Patella Exam Normal Normal  Reflexes Normal Normal  Neurologic Normal Normal  The hip has internal rotation to 50 degrees and external rotation to 40 degrees with no pain.  Imaging Studies: Imaging: AP lateral and sunrise views of the right knee are ordered interpreted by me in the office today. Impression: Patient has severe degenerative changes in the lateral compartment with complete loss of joint space with valgus deformity to the  right knee. Moderate patellofemoral osteoarthritis. No evidence of acute bony normality or effusion.  Assessment:  ICD-10-CM ICD-9-CM  1. Chronic pain of right knee M25.561 719.46  G89.29 338.29  2. Primary osteoarthritis of right knee M17.11 715.16    Plan: 1. Risks, benefits, complications of a right total knee arthroplasty were discussed with the patient. Patient has agreed and consented procedure with Dr. Rosita Kea on 04/10/2017.   Electronically signed by Caitlin Musca, PA at 04/02/2018 11:28 AM Mendez

## 2018-04-10 NOTE — Anesthesia Procedure Notes (Signed)
Date/Time: 04/10/2018 7:41 AM Performed by: Junious Silk, CRNA Pre-anesthesia Checklist: Patient identified, Emergency Drugs available, Suction available, Patient being monitored and Timeout performed Oxygen Delivery Method: Simple face mask

## 2018-04-10 NOTE — Transfer of Care (Signed)
Immediate Anesthesia Transfer of Care Note  Patient: Caitlin Mendez  Procedure(s) Performed: TOTAL KNEE ARTHROPLASTY (Right )  Patient Location: PACU  Anesthesia Type:Spinal  Level of Consciousness: awake, alert  and oriented  Airway & Oxygen Therapy: Patient Spontanous Breathing and Patient connected to face mask oxygen  Post-op Assessment: Report given to RN and Post -op Vital signs reviewed and stable  Post vital signs: Reviewed and stable  Last Vitals:  Vitals Value Taken Time  BP    Temp    Pulse    Resp    SpO2      Last Pain:  Vitals:   04/10/18 0617  TempSrc: Oral  PainSc: 6          Complications: No apparent anesthesia complications

## 2018-04-10 NOTE — Progress Notes (Signed)
Pt. admitted to unit, room 140 from PACU. Oriented to room, call bell, Ascom phones and staff. Bed in low position. Fall safety plan reviewed, non-skid socks in place, bed alarm on. Full assessment to Epic; skin assessed with Irving Burton, RN. Surgical dressing clean and dry. Bone foam and polar care on. Wound Vac in place.  Family at bedside. Will continue to monitor.

## 2018-04-10 NOTE — Anesthesia Procedure Notes (Addendum)
Spinal  Patient location during procedure: OR Start time: 04/10/2018 7:20 AM End time: 04/10/2018 7:25 AM Staffing Resident/CRNA: Nelda Marseille, CRNA Performed: resident/CRNA  Preanesthetic Checklist Completed: patient identified, site marked, surgical consent, pre-op evaluation, timeout performed, IV checked, risks and benefits discussed and monitors and equipment checked Spinal Block Patient position: sitting Prep: Betadine Patient monitoring: heart rate, continuous pulse ox, blood pressure and cardiac monitor Approach: midline Location: L4-5 Injection technique: single-shot Needle Needle type: Whitacre and Introducer  Needle gauge: 25 G Needle length: 9 cm Additional Notes Negative paresthesia. Negative blood return. Positive free-flowing CSF. Expiration date of kit checked and confirmed. Patient tolerated procedure well, without complications.

## 2018-04-10 NOTE — Op Note (Signed)
04/10/2018  9:19 AM  PATIENT:  Caitlin Mendez  62 y.o. female  PRE-OPERATIVE DIAGNOSIS:  PRIMARY OSTEOARTHRITIS OF RIGHT KNEE  POST-OPERATIVE DIAGNOSIS:  PRIMARY OSTEOARTHRITIS OF RIGHT KNEE  PROCEDURE:  Procedure(s): TOTAL KNEE ARTHROPLASTY (Right)  SURGEON: Leitha Schuller, MD  ASSISTANTS: Cranston Neighbor, PA-C  ANESTHESIA:   spinal  EBL:  Total I/O In: 1000 [I.V.:1000] Out: 550 [Urine:500; Blood:50]  BLOOD ADMINISTERED:none  DRAINS: none   LOCAL MEDICATIONS USED:  MARCAINE    and OTHER Exparel morphine and Toradol  SPECIMEN:  No Specimen  DISPOSITION OF SPECIMEN:  N/A  COUNTS:  YES  TOURNIQUET:  * Missing tourniquet times found for documented tourniquets in log: [id number] *  IMPLANTS: Medacta GMK 3 and right femoral PS component, 2 right tibial component with short stem 14 mm PS insert, 1 patella, all components cemented  DICTATION: .Dragon Dictation  patient was brought to the operating room and after adequate anesthesia was obtainedrightlegwas prepped and draped in thesterile fashion with tourniquet applied to the  rightupper thigh. After patient identification and timeout procedure was completed thetourniquet was raised and midline skin incision was made followed by medial parapatellar arthrotomy. Inspection revealed significant patellofemoral degenerative change of lateral compartment, there was exposed bone and eburnation of the lateral compartment both femoral and tibial condyleswith moderate mild medial compartmentosteoarthritis. There is also synovitis present within the joint which was excised. The ACL, PCL and fat pad were excised as well. The proximal tibia was exposed and themy knee cutting guide was applied proximal tibia cut carried out.Distal femoral cutting guide applied in a similar fashion and distal femoral cut carried outThe femur was then prepared withsize3cutting guide withanterior posterior and chamfer cuts made with appropriate  size cuts and this appeared to fit well the femoraltrial was placed distal drill holes made followed by the trochlear groove cut, as well as box cut for the PS implant. The tibia was prepared using the tibial template baseplate size 2 this was pinned into position proximal tibial preparation carried out for short stem. A teammm insert gave excellent stability and full extension. The trials were removed at this point and the bony surfaces thoroughly irrigated and dried after the patellar cutting been carried out with the patellar cutting guide drill holes made and sized to a size 1. The above local was then infiltrated in the periarticular tissues and the bony surfaces dried the tibial component was cemented to place first with excess cement removed followed by the polyethylene component with the set screw and a torque screwdriver. The femoral component was placed in the knee held in extension as the cement set with the patellar button clamped into place. After the cement hadset,excess cement was removed and the knee was thoroughly again thoroughly irrigated with tourniquet let down. A lateral release was performed because of some tendency for the patella to want to track laterally. The arthrotomy was then repaired using heavy Manson Allan,.3-0 v-loccuticular followed by skin staples.Wound was then covered with incisional wound VAC.   PLAN OF CARE: Admit to inpatient   PATIENT DISPOSITION:  PACU - hemodynamically stable.

## 2018-04-10 NOTE — [place] FL2 (Signed)
[place] MEDICAID FL2 LEVEL OF CARE SCREENING TOOL     IDENTIFICATION  Patient Name: Caitlin Mendez Birthdate: 11-30-1955 Sex: female Admission Date (Current Location): 04/10/2018  [place] Number:  Chiropodist and Address:  [facility], [address] [place], [place] [address]      Provider Number: [id number]  Attending Physician Name and Address:  Kennedy Bucker, MD  Relative Name and Phone Number:       Current Level of Care: Hospital Recommended Level of Care: Skilled Nursing Facility Prior Approval Number:    Date Approved/Denied:   PASRR Number: ([id number])  Discharge Plan: SNF    Current Diagnoses: Patient Active Problem List   Diagnosis Date Noted  . Status post total knee replacement using cement, right 04/10/2018    Orientation RESPIRATION BLADDER Height & Weight     Self, Time, Situation, Place  Normal Continent Weight: 212 lb 1.3 oz (96.2 kg) Height:  5\' 4"  (162.6 cm)  BEHAVIORAL SYMPTOMS/MOOD NEUROLOGICAL BOWEL NUTRITION STATUS      Continent Diet(Diet: Regular )  AMBULATORY STATUS COMMUNICATION OF NEEDS Skin   Extensive Assist Verbally Surgical wounds, Wound Vac(Incision: Right Knee. Provena Wound Vac. )                       Personal Care Assistance Level of Assistance  Bathing, Feeding, Dressing Bathing Assistance: Limited assistance Feeding assistance: Independent Dressing Assistance: Limited assistance     Functional Limitations Info  Sight, Hearing, Speech Sight Info: Adequate Hearing Info: Adequate Speech Info: Adequate    SPECIAL CARE FACTORS FREQUENCY  PT (By licensed PT), OT (By licensed OT)     PT Frequency: (5) OT Frequency: (5)            Contractures      Additional Factors Info  Code Status, Allergies Code Status Info: (Full Code. ) Allergies Info: (Sulfamethoxazole-trimethoprim, Phenytoin Sodium)           Current Medications (04/10/2018):  This is the  current hospital active medication list Current Facility-Administered Medications  Medication Dose Route Frequency Provider Last Rate Last Dose  . 0.9 %  sodium chloride infusion   Intravenous Continuous Kennedy Bucker, MD 75 mL/hr at 04/10/18 1500    . [START ON 04/11/2018] acetaminophen (TYLENOL) tablet 325-650 mg  325-650 mg Oral Q6H PRN Kennedy Bucker, MD      . acetaminophen (TYLENOL) tablet 500 mg  500 mg Oral Q6H Kennedy Bucker, MD   500 mg at 04/10/18 1211  . alum & mag hydroxide-simeth (MAALOX/MYLANTA) 200-200-20 MG/5ML suspension 30 mL  30 mL Oral Q4H PRN Kennedy Bucker, MD      . Melene Muller ON 04/11/2018] aspirin EC tablet 325 mg  325 mg Oral Q breakfast Kennedy Bucker, MD      . bisacodyl (DULCOLAX) EC tablet 5 mg  5 mg Oral Daily PRN Kennedy Bucker, MD      . ceFAZolin (ANCEF) IVPB 2g/100 mL premix  2 g Intravenous Q6H Kennedy Bucker, MD 200 mL/hr at 04/10/18 1615 2 g at 04/10/18 1615  . diphenhydrAMINE (BENADRYL) 12.5 MG/5ML elixir 12.5-25 mg  12.5-25 mg Oral Q4H PRN Kennedy Bucker, MD      . docusate sodium (COLACE) capsule 100 mg  100 mg Oral BID Kennedy Bucker, MD      . FLUoxetine (PROZAC) capsule 40 mg  40 mg Oral Daily Kennedy Bucker, MD   40 mg at 04/10/18 1217  . gabapentin (NEURONTIN) capsule 300  mg  300 mg Oral TID Kennedy Bucker, MD   300 mg at 04/10/18 1612  . HYDROcodone-acetaminophen (NORCO) 7.5-325 MG per tablet 1-2 tablet  1-2 tablet Oral Q4H PRN Kennedy Bucker, MD      . HYDROcodone-acetaminophen (NORCO/VICODIN) 5-325 MG per tablet 1-2 tablet  1-2 tablet Oral Q4H PRN Kennedy Bucker, MD   2 tablet at 04/10/18 1612  . [START ON 04/11/2018] levothyroxine (SYNTHROID, LEVOTHROID) tablet 50 mcg  50 mcg Oral Henrene Hawking, Casimiro Needle, MD      . loratadine (CLARITIN) tablet 10 mg  10 mg Oral Daily PRN Kennedy Bucker, MD      . magnesium citrate solution 1 Bottle  1 Bottle Oral Once PRN Kennedy Bucker, MD      . menthol-cetylpyridinium (CEPACOL) lozenge 3 mg  1 lozenge Oral PRN Kennedy Bucker, MD       Or   . phenol (CHLORASEPTIC) mouth spray 1 spray  1 spray Mouth/Throat PRN Kennedy Bucker, MD      . methocarbamol (ROBAXIN) tablet 500 mg  500 mg Oral Q6H PRN Kennedy Bucker, MD       Or  . methocarbamol (ROBAXIN) 500 mg in dextrose 5 % 50 mL IVPB  500 mg Intravenous Q6H PRN Kennedy Bucker, MD      . metoCLOPramide (REGLAN) tablet 5-10 mg  5-10 mg Oral Q8H PRN Kennedy Bucker, MD       Or  . metoCLOPramide (REGLAN) injection 5-10 mg  5-10 mg Intravenous Q8H PRN Kennedy Bucker, MD      . morphine 2 MG/ML injection 0.5-1 mg  0.5-1 mg Intravenous Q2H PRN Kennedy Bucker, MD      . multivitamin with minerals tablet 1 tablet  1 tablet Oral Daily Kennedy Bucker, MD   1 tablet at 04/10/18 1217  . ondansetron (ZOFRAN) tablet 4 mg  4 mg Oral Q6H PRN Kennedy Bucker, MD       Or  . ondansetron [facility]) injection 4 mg  4 mg Intravenous Q6H PRN Kennedy Bucker, MD      . Melene Muller ON 04/11/2018] pantoprazole (PROTONIX) EC tablet 40 mg  40 mg Oral Q1200 Kennedy Bucker, MD      . senna-docusate (Senokot-S) tablet 1 tablet  1 tablet Oral QHS PRN Kennedy Bucker, MD      . traMADol Janean Sark) tablet 50 mg  50 mg Oral Q6H Kennedy Bucker, MD   50 mg at 04/10/18 1211  . zolpidem (AMBIEN) tablet 5 mg  5 mg Oral QHS PRN Kennedy Bucker, MD         Discharge Medications: Please see discharge summary for a list of discharge medications.  Relevant Imaging Results:  Relevant Lab Results:   Additional Information (SSN: [id number])  [name], Darleen Crocker, LCSW

## 2018-04-10 NOTE — Progress Notes (Signed)
IS education complete, pt understands technique and reason for use. Pt inspire 1750-222ml. Pt independent with use

## 2018-04-10 NOTE — Anesthesia Postprocedure Evaluation (Signed)
Anesthesia Post Note  Patient: Caitlin Mendez  Procedure(s) Performed: TOTAL KNEE ARTHROPLASTY (Right )  Patient location during evaluation: PACU Anesthesia Type: Spinal Level of consciousness: awake and alert Pain management: pain level controlled Vital Signs Assessment: post-procedure vital signs reviewed and stable Respiratory status: spontaneous breathing, nonlabored ventilation, respiratory function stable and patient connected to nasal cannula oxygen Cardiovascular status: blood pressure returned to baseline and stable Postop Assessment: no apparent nausea or vomiting Anesthetic complications: no     Last Vitals:  Vitals:   04/10/18 1339 04/10/18 1439  BP: 130/65 105/89  Pulse: 67 61  Resp:    Temp: 36.5 C 36.5 C  SpO2: 100% 99%    Last Pain:  Vitals:   04/10/18 1439  TempSrc: Oral  PainSc:                  Jovita Gamma

## 2018-04-10 NOTE — Anesthesia Preprocedure Evaluation (Signed)
Anesthesia Evaluation  Patient identified by MRN, date of birth, ID band Patient awake    Reviewed: Allergy & Precautions, H&P , NPO status , Patient's Chart, lab work & pertinent test results  Airway Mallampati: I  TM Distance: >3 FB Neck ROM: full    Dental  (+) Teeth Intact   Pulmonary neg pulmonary ROS,    breath sounds clear to auscultation       Cardiovascular negative cardio ROS   Rhythm:regular Rate:Normal     Neuro/Psych negative neurological ROS  negative psych ROS   GI/Hepatic negative GI ROS, Neg liver ROS, GERD  ,  Endo/Other  negative endocrine ROS  Renal/GU      Musculoskeletal   Abdominal   Peds  Hematology negative hematology ROS (+)   Anesthesia Other Findings Past Medical History: No date: Anxiety No date: GERD (gastroesophageal reflux disease) No date: Hypothyroidism  Past Surgical History: No date: ABDOMINAL HYSTERECTOMY No date: BACK SURGERY     Comment:  2 disc removed from back No date: BIOPSY THYROID 2008: BREAST SURGERY; Right     Comment:  biopsy No date: MENISCECTOMY; Right  BMI    Body Mass Index:  36.40 kg/m      Reproductive/Obstetrics negative OB ROS                             Anesthesia Physical Anesthesia Plan  ASA: II  Anesthesia Plan: Spinal   Post-op Pain Management:    Induction:   PONV Risk Score and Plan: Propofol infusion  Airway Management Planned:   Additional Equipment:   Intra-op Plan:   Post-operative Plan:   Informed Consent: I have reviewed the patients History and Physical, chart, labs and discussed the procedure including the risks, benefits and alternatives for the proposed anesthesia with the patient or authorized representative who has indicated his/her understanding and acceptance.   Dental Advisory Given  Plan Discussed with: Anesthesiologist, CRNA and Surgeon  Anesthesia Plan Comments:          Anesthesia Quick Evaluation

## 2018-04-10 NOTE — Anesthesia Post-op Follow-up Note (Signed)
Anesthesia QCDR form completed.        

## 2018-04-11 ENCOUNTER — Encounter: Payer: Self-pay | Admitting: Orthopedic Surgery

## 2018-04-11 LAB — CBC
HCT: 37.3 % (ref 35.0–47.0)
Hemoglobin: 13 g/dL (ref 12.0–16.0)
MCH: 32 pg (ref 26.0–34.0)
MCHC: 34.8 g/dL (ref 32.0–36.0)
MCV: 92 fL (ref 80.0–100.0)
Platelets: 172 10*3/uL (ref 150–440)
RBC: 4.06 MIL/uL (ref 3.80–5.20)
RDW: 13.3 % (ref 11.5–14.5)
WBC: 8.5 10*3/uL (ref 3.6–11.0)

## 2018-04-11 LAB — BASIC METABOLIC PANEL
ANION GAP: 6 (ref 5–15)
BUN: 13 mg/dL (ref 8–23)
CALCIUM: 8.3 mg/dL — AB (ref 8.9–10.3)
CO2: 27 mmol/L (ref 22–32)
Chloride: 105 mmol/L (ref 98–111)
Creatinine, Ser: 0.62 mg/dL (ref 0.44–1.00)
GFR calc Af Amer: >60 mL/min (ref >60)
Glucose, Bld: 120 mg/dL — ABNORMAL HIGH (ref 70–99)
Potassium: 3.8 mmol/L (ref 3.5–5.1)
SODIUM: 138 mmol/L (ref 135–145)

## 2018-04-11 MED ORDER — SCOPOLAMINE 1 MG/3DAYS TD PT72
1.0000 | MEDICATED_PATCH | TRANSDERMAL | Status: DC
  Administered 2018-04-11: 1.5 mg via TRANSDERMAL
  Filled 2018-04-11: qty 1

## 2018-04-11 NOTE — Care Management (Signed)
Patient discharging home with her dad to [address], [place], [place] [address] per patient.

## 2018-04-11 NOTE — Progress Notes (Signed)
   Subjective: 1 Day Post-Op Procedure(s) (LRB): TOTAL KNEE ARTHROPLASTY (Right) Patient reports pain as 6 on 0-10 scale.   Patient is complaining of moderate to severe pain. Norco caused N/V yesterday. NO NV today Denies any CP, SOB, ABD pain. We will continue therapy today.  Plan is to go Home after hospital stay.  Objective: Vital signs in last 24 hours: Temp:  [97.7 F (36.5 C)-98.5 F (36.9 C)] 98.4 F (36.9 C) (09/04 0700) Pulse Rate:  [53-84] 84 (09/04 0700) Resp:  [8-26] 14 (09/04 0420) BP: (92-155)/(55-89) 151/81 (09/04 0700) SpO2:  [92 %-100 %] 97 % (09/04 0700)  Intake/Output from previous day: 09/03 0701 - 09/04 0700 In: 3332.4 [P.O.:480; I.V.:2752.4; IV Piggyback:100] Out: 1150 [Urine:1100; Blood:50] Intake/Output this shift: No intake/output data recorded.  Recent Labs    04/11/18 0335  HGB 13.0   Recent Labs    04/11/18 0335  WBC 8.5  RBC 4.06  HCT 37.3  PLT 172   Recent Labs    04/11/18 0335  NA 138  K 3.8  CL 105  CO2 27  BUN 13  CREATININE 0.62  GLUCOSE 120*  CALCIUM 8.3*   No results for input(s): LABPT, INR in the last 72 hours.  EXAM General - Patient is Alert, Appropriate and Oriented Extremity - Neurovascular intact Sensation intact distally Intact pulses distally Dorsiflexion/Plantar flexion intact No cellulitis present Compartment soft Dressing - dressing C/D/I and no drainage, wound vac intact Motor Function - intact, moving foot and toes well on exam.   Past Medical History:  Diagnosis Date  . Anxiety   . GERD (gastroesophageal reflux disease)   . Hypothyroidism     Assessment/Plan:   1 Day Post-Op Procedure(s) (LRB): TOTAL KNEE ARTHROPLASTY (Right) Active Problems:   Status post total knee replacement using cement, right  Estimated body mass index is 36.4 kg/m as calculated from the following:   Height as of this encounter: 5\' 4"  (1.626 m).   Weight as of this encounter: 96.2 kg. Advance diet Up with  therapy  Needs BM Recheck labs in the am Scopolamine patch ordered to help with Nausea from pain meds. CM to assist with discharge  DVT Prophylaxis - Aspirin, TED hose and SCDs Weight-Bearing as tolerated to right leg   T. Cranston Neighbor, PA-C [facility] 04/11/2018, 8:14 AM

## 2018-04-11 NOTE — Progress Notes (Signed)
Clinical Social Worker (CSW) received SNF consult. PT is recommending home health. RN case manager aware of above. Please reconsult if future social work needs arise. CSW signing off.   Adel Neyer, LCSW [phone] 

## 2018-04-11 NOTE — Progress Notes (Signed)
Physical Therapy Treatment Patient Details Name: Caitlin Mendez MRN: [id number] DOB: [birth year] Today's Date: 04/11/2018    History of Present Illness Pt underwent R TKR without reported post-op complications. PMH includes depression, GERD, low B12, and previous R knee surgery. PT evaluation performed on POD#0. OT evaluation performed on POD#1.    PT Comments    Pt is making good progress with therapy on this date. She does report increase in knee pain which is somewhat limiting. She requires RLE assist to get to EOB due to pain. Pt reports a lot of pain with transfers but is able to complete without assist from therapist. She is steady once in standing. Pt is able to ambulate from bed to door and then around end of bed to recliner. Gait is slow and deliberate. Decreased weight shifting to RLE. Pt provided cues for proper sequencing with rolling walker. Intermittent grimacing/groaning during ambulation. AAROM is worse today secondary to pain and guarding. Measurement is -1 to 70 degrees. Will continue to progress ambulation distance during PM session. Pt will benefit from PT services to address deficits in strength, balance, and mobility in order to return to full function at home.    Follow Up Recommendations  Home health PT     Equipment Recommendations  Rolling walker with 5" wheels    Recommendations for Other Services       Precautions / Restrictions Precautions Precautions: Knee Required Braces or Orthoses: Knee Immobilizer - Right Knee Immobilizer - Right: Discontinue once straight leg raise with < 10 degree lag Restrictions Weight Bearing Restrictions: Yes RLE Weight Bearing: Weight bearing as tolerated    Mobility  Bed Mobility Overal bed mobility: Needs Assistance Bed Mobility: Supine to Sit     Supine to sit: Min assist Sit to supine: Min guard   General bed mobility comments: Pt requires additional time and RLE assist to move toward EOB  Transfers Overall  transfer level: Needs assistance Equipment used: Rolling walker (2 wheeled) Transfers: Sit to/from Stand Sit to Stand: Min guard        Lateral/Scoot Transfers: Supervision General transfer comment: Pt reports a lot of pain with transfers but is able to complete without assist from therapist. She is steady once in standing.   Ambulation/Gait Ambulation/Gait assistance: Min guard Gait Distance (Feet): 60 Feet Assistive device: Rolling walker (2 wheeled)       General Gait Details: Pt is able to ambulate from bed to door and then around end of bed to recliner. Gait is slow and deliberate. Decreased weight shifting to RLE. Pt provided cues for proper sequencing with rolling walker. Intermittent grimacing/groaning during ambulation   Stairs             Wheelchair Mobility    Modified Rankin (Stroke Patients Only)       Balance Overall balance assessment: Needs assistance Sitting-balance support: No upper extremity supported Sitting balance-Leahy Scale: Good Sitting balance - Comments: pt leaning to L side to offweight RLE 2/2 pain   Standing balance support: Bilateral upper extremity supported Standing balance-Leahy Scale: Fair                              Cognition Arousal/Alertness: Awake/alert Behavior During Therapy: WFL for tasks assessed/performed Overall Cognitive Status: Within Functional Limits for tasks assessed  Exercises Total Joint Exercises Ankle Circles/Pumps: Both;10 reps Quad Sets: Both;10 reps Gluteal Sets: Both;10 reps Towel Squeeze: Both;10 reps Short Arc Quad: Right;10 reps Heel Slides: Right;10 reps Hip ABduction/ADduction: Right;10 reps Straight Leg Raises: Right;10 reps;Other (comment)(requires 2 finger assist today) Other Exercises Other Exercises: pt educated in polar care mgt Other Exercises: pt educated in falls prevention strategies     General Comments         Pertinent Vitals/Pain Pain Assessment: 0-10 Pain Score: 8  Pain Location: Increased R knee pain today at rest and with activity compared to yesterday Pain Descriptors / Indicators: Crying;Grimacing;Guarding;Operative site guarding;Sharp Pain Intervention(s): Limited activity within patient's tolerance;Monitored during session;Premedicated before session;Repositioned;Ice applied;RN gave pain meds during session;Utilized relaxation techniques    Home Living Family/patient expects to be discharged to:: Private residence Living Arrangements: Alone Available Help at Discharge: Family;Available PRN/intermittently(pt has siblings who all work and also provide care for their father who is blind - very minimal assist available) Type of Home: House Home Access: Stairs to enter Entrance Stairs-Rails: None Home Layout: One level Home Equipment: Environmental consultant - 4 wheels;Cane - single point Additional Comments: pt states house is ~62 years old, very narrow doorways    Prior Function Level of Independence: Independent with assistive device(s)      Comments: Independent with ADLs/IADLs. Intermittent use of single point cane 2/2 increasing R knee pain. No falls. 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, however pt states she is being layed off work in December   PT Goals (current goals can now be found in the care plan section) Acute Rehab PT Goals Patient Stated Goal: have less pain and return to prior level of function PT Goal Formulation: With patient Time For Goal Achievement: 04/24/18 Potential to Achieve Goals: Good Progress towards PT goals: Progressing toward goals    Frequency    BID      PT Plan Current plan remains appropriate    Co-evaluation              AM-PAC PT "6 Clicks" Daily Activity  Outcome Measure  Difficulty turning over in bed (including adjusting bedclothes, sheets and blankets)?: A Little Difficulty moving from  lying on back to sitting on the side of the bed? : A Little Difficulty sitting down on and standing up from a chair with arms (e.g., wheelchair, bedside commode, etc,.)?: A Little Help needed moving to and from a bed to chair (including a wheelchair)?: A Little Help needed walking in hospital room?: A Little Help needed climbing 3-5 steps with a railing? : A Lot 6 Click Score: 17    End of Session Equipment Utilized During Treatment: Gait belt Activity Tolerance: Patient tolerated treatment well Patient left: with call bell/phone within reach;with chair alarm set;with SCD's reapplied;Other (comment);in chair(towel roll under heel, polar care in place) Nurse Communication: Mobility status;Other (comment)(RN present to observe mobility ) PT Visit Diagnosis: Unsteadiness on feet (R26.81);Muscle weakness (generalized) (M62.81);Pain Pain - Right/Left: Right Pain - part of body: Knee     Time: 1005-1036 PT Time Calculation (min) (ACUTE ONLY): 31 min  Charges:  $Gait Training: 8-22 mins $Therapeutic Exercise: 8-22 mins                    Sharalyn Ink Huprich PT, DPT, GCS    Huprich,Jason 04/11/2018, 11:48 AM

## 2018-04-11 NOTE — Evaluation (Signed)
Occupational Therapy Evaluation Patient Details Name: Caitlin Mendez MRN: [id number] DOB: [birth year] Today's Date: 04/11/2018    History of Present Illness Pt underwent R TKR without reported post-op complications. PMH includes depression, GERD, low B12, and previous R knee surgery. PT evaluation performed on POD#0. OT evaluation performed on POD#1.   Clinical Impression   Pt seen for OT evaluation this date, POD#1 from above surgery. Pt was independent in all ADLs prior to surgery, however occasionally using SPC for mobility 2/2 R knee pain. Pt is eager to return to PLOF with less pain and improved safety and independence. Pt currently requires minimal assist for LB ADL while in seated position due to significant pain and limited AROM of R knee. Pt required CGA for RLE during bed mobility to come sit EOB. Pt tearful 2/2 pain, emotional support provided and RN in to provide additional pain medicine. Pt able to tolerate sitting EOB through instruction in polar care mgt, falls prevention strategies, home/routines modifications, DME/AE for LB bathing and dressing tasks, and compression stocking mgt. Pt would benefit from skilled OT services including additional instruction in dressing techniques with or without assistive devices for dressing and bathing skills to support recall and carryover prior to discharge and ultimately to maximize safety, independence, and minimize falls risk and caregiver burden. Pt currently very pain limited. Anticipate improved independence with better pain control. Recommend HHOT Services upon discharge. Will continue to monitor for appropriateness of STR if pain cannot be better controlled.       Follow Up Recommendations  Home health OT    Equipment Recommendations  3 in 1 bedside commode;Tub/shower bench;Other (comment)(reacher, sock aide)    Recommendations for Other Services       Precautions / Restrictions Precautions Precautions: Knee Required Braces or  Orthoses: Knee Immobilizer - Right Knee Immobilizer - Right: Discontinue once straight leg raise with < 10 degree lag Restrictions Weight Bearing Restrictions: Yes RLE Weight Bearing: Weight bearing as tolerated      Mobility Bed Mobility Overal bed mobility: Needs Assistance Bed Mobility: Supine to Sit;Sit to Supine     Supine to sit: Min guard;HOB elevated Sit to supine: Min guard   General bed mobility comments: additional time/effort, limited by significant pain  Transfers Overall transfer level: Needs assistance   Transfers: Lateral/Scoot Transfers          Lateral/Scoot Transfers: Supervision General transfer comment: Pt in too much pain to attempt to stand, performed lateral scoots in bed with supervision and additional time/effort to perform 2/2 pain    Balance Overall balance assessment: Needs assistance Sitting-balance support: Bilateral upper extremity supported;Single extremity supported;Feet supported Sitting balance-Leahy Scale: Fair Sitting balance - Comments: pt leaning to L side to offweight RLE 2/2 pain                                   ADL either performed or assessed with clinical judgement   ADL Overall ADL's : Needs assistance/impaired Eating/Feeding: Sitting;Independent Eating/Feeding Details (indicate cue type and reason): pt sat EOB and ate independently Grooming: Sitting;Independent   Upper Body Bathing: Sitting;Supervision/ safety   Lower Body Bathing: Sit to/from stand;Minimal assistance   Upper Body Dressing : Sitting;Supervision/safety   Lower Body Dressing: Sit to/from stand;Minimal assistance Lower Body Dressing Details (indicate cue type and reason): pt educated in compression stocking mgt and AE for LB dressing tasks to maximize safety and independence  Vision Baseline Vision/History: Wears glasses Wears Glasses: Reading only;Distance only(has 2 pair) Patient Visual Report: No change  from baseline       Perception     Praxis      Pertinent Vitals/Pain Pain Assessment: 0-10 Pain Score: 10-Worst pain ever Pain Location: R knee with movement Pain Descriptors / Indicators: Crying;Grimacing;Guarding;Operative site guarding;Sharp Pain Intervention(s): Limited activity within patient's tolerance;Monitored during session;Premedicated before session;Repositioned;Ice applied;RN gave pain meds during session;Utilized relaxation techniques     Hand Dominance Right   Extremity/Trunk Assessment Upper Extremity Assessment Upper Extremity Assessment: Overall WFL for tasks assessed   Lower Extremity Assessment Lower Extremity Assessment: Defer to PT evaluation;RLE deficits/detail RLE: Unable to fully assess due to immobilization;Unable to fully assess due to pain   Cervical / Trunk Assessment Cervical / Trunk Assessment: Normal   Communication Communication Communication: No difficulties   Cognition Arousal/Alertness: Awake/alert Behavior During Therapy: WFL for tasks assessed/performed Overall Cognitive Status: Within Functional Limits for tasks assessed                                     General Comments       Exercises Other Exercises Other Exercises: pt educated in polar care mgt Other Exercises: pt educated in falls prevention strategies    Shoulder Instructions      Home Living Family/patient expects to be discharged to:: Private residence Living Arrangements: Alone Available Help at Discharge: Family;Available PRN/intermittently(pt has siblings who all work and also provide care for their father who is blind - very minimal assist available) Type of Home: House Home Access: Stairs to enter [employer] of Steps: 5 Entrance Stairs-Rails: None Home Layout: One level     Bathroom Shower/Tub: Chief Strategy Officer: Standard Bathroom Accessibility: No   Home Equipment: Environmental consultant - 4 wheels;Cane - single point    Additional Comments: pt states house is ~62 years old, very narrow doorways      Prior Functioning/Environment Level of Independence: Independent with assistive device(s)        Comments: Independent with ADLs/IADLs. Intermittent use of single point cane 2/2 increasing R knee pain. No falls. 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[employer], however pt states she is being layed off work in December        OT Problem List: Decreased knowledge of use of DME or AE;Decreased range of motion;Pain;Impaired balance (sitting and/or standing)      OT Treatment/Interventions: Self-care/ADL training;Balance training;Therapeutic exercise;Therapeutic activities;DME and/or AE instruction;Patient/family education    OT Goals(Current goals can be found in the care plan section) Acute Rehab OT Goals Patient Stated Goal: have less pain and return to prior level of function OT Goal Formulation: With patient Time For Goal Achievement: 04/25/18 Potential to Achieve Goals: Good ADL Goals Pt Will Perform Lower Body Dressing: with supervision;sit to/from stand;with adaptive equipment Pt Will Transfer to Toilet: with supervision;ambulating(raised toilet, LRAD for amb) Additional ADL Goal #1: Pt will independently instruct caregiver in compression stocking mgt including donning/doffing, wear schedule, and positioning. Additional ADL Goal #2: Pt will independently instruct caregiver in polar care mgt including positioning, donning/doffing, and wear schedule.  OT Frequency: Min 1X/week   Barriers to D/C:            Co-evaluation              AM-PAC PT "6 Clicks" Daily Activity  Outcome Measure Help from another person eating meals?: None Help from another person taking care of personal grooming?: None Help from another person toileting, which includes using toliet, bedpan, or urinal?: A Little Help from another person bathing (including washing, rinsing,  drying)?: A Little Help from another person to put on and taking off regular upper body clothing?: None Help from another person to put on and taking off regular lower body clothing?: A Little 6 Click Score: 21   End of Session    Activity Tolerance: Patient limited by pain Patient left: in bed;with call bell/phone within reach;with bed alarm set;Other (comment);with SCD's reapplied(rolled towels under B ankles, polar care in place on R knee)  OT Visit Diagnosis: Other abnormalities of gait and mobility (R26.89);Pain Pain - Right/Left: Right Pain - part of body: Knee;Hip;Leg                Time: 2297-9892 OT Time Calculation (min): 42 min Charges:  OT General Charges $OT Visit: 1 Visit OT Evaluation $OT Eval Low Complexity: 1 Low OT Treatments $Self Care/Home Management : 23-37 mins  Richrd Prime, MPH, MS, OTR/L ascom [phone] 04/11/18, 10:05 AM

## 2018-04-11 NOTE — Progress Notes (Addendum)
Physical Therapy Treatment Patient Details Name: Caitlin Mendez MRN: [id number] DOB: 1955/08/12 Today's Date: 04/11/2018    History of Present Illness Pt underwent R TKR without reported post-op complications. PMH includes depression, GERD, low B12, and previous R knee surgery. PT evaluation performed on POD#0. OT evaluation performed on POD#1.    PT Comments    Pt demonstrates ability to progress ambulation distance this afternoon. She is able to ambulate from recliner around bed to RN station and back. Cues for sequencing with walker and encouragement to increase L step length. Cues to decrease UE reliance on rolling walker. Gait speed slowly increases as distance progresses. She is able to complete all exercises as instructed by therapist while sitting in recliner. Will progress ambulation distance tomorrow and attempt stairs as pt is able. It is likely that pt will need therapy twice tomorrow again. Pt will benefit from PT services to address deficits in strength, balance, and mobility in order to return to full function at home.    Follow Up Recommendations  Home health PT     Equipment Recommendations  Rolling walker with 5" wheels;Other (comment);3in1 (PT)(Tub bench)    Recommendations for Other Services       Precautions / Restrictions Precautions Precautions: Knee Required Braces or Orthoses: Knee Immobilizer - Right Knee Immobilizer - Right: Discontinue once straight leg raise with < 10 degree lag Restrictions Weight Bearing Restrictions: Yes RLE Weight Bearing: Weight bearing as tolerated    Mobility  Bed Mobility               General bed mobility comments: Received and left upright in recliner  Transfers Overall transfer level: Needs assistance Equipment used: Rolling walker (2 wheeled) Transfers: Sit to/from Stand Sit to Stand: Min guard        Lateral/Scoot Transfers: Supervision General transfer comment: Pt with less pain during transfers this  afternoon but still present. UE support required on walker  Ambulation/Gait Ambulation/Gait assistance: Min guard Gait Distance (Feet): 120 Feet Assistive device: Rolling walker (2 wheeled)       General Gait Details: Pt is able to ambulate from recliner around bed to RN station and back. Cues for sequencing with walker and encouragement to increase L step length. Cues to decrease UE reliance on rolling walker. Gait speed slowly increases as distance progresses   Social research officer, government Rankin (Stroke Patients Only)       Balance Overall balance assessment: Needs assistance Sitting-balance support: No upper extremity supported Sitting balance-Leahy Scale: Good     Standing balance support: Bilateral upper extremity supported Standing balance-Leahy Scale: Fair                              Cognition Arousal/Alertness: Awake/alert Behavior During Therapy: WFL for tasks assessed/performed Overall Cognitive Status: Within Functional Limits for tasks assessed                                        Exercises Total Joint Exercises Ankle Circles/Pumps: Both;10 reps Quad Sets: Both;10 reps Gluteal Sets: Both;10 reps Towel Squeeze: Both;10 reps Short Arc Quad: Right;10 reps Heel Slides: Right;10 reps Hip ABduction/ADduction: Right;10 reps Straight Leg Raises: Right;10 reps;Other (comment)(Less assist during PM session)    General Comments  Pertinent Vitals/Pain Pain Assessment: 0-10 Pain Score: 6  Pain Location: Increased R knee pain today at rest and with activity compared to yesterday Pain Descriptors / Indicators: Crying;Grimacing;Guarding;Operative site guarding;Sharp Pain Intervention(s): Monitored during session    Home Living                      Prior Function            PT Goals (current goals can now be found in the care plan section) Acute Rehab PT Goals Patient Stated Goal:  have less pain and return to prior level of function PT Goal Formulation: With patient Time For Goal Achievement: 04/24/18 Potential to Achieve Goals: Good Progress towards PT goals: Progressing toward goals    Frequency    BID      PT Plan Current plan remains appropriate    Co-evaluation              AM-PAC PT "6 Clicks" Daily Activity  Outcome Measure  Difficulty turning over in bed (including adjusting bedclothes, sheets and blankets)?: A Little Difficulty moving from lying on back to sitting on the side of the bed? : A Little Difficulty sitting down on and standing up from a chair with arms (e.g., wheelchair, bedside commode, etc,.)?: A Little Help needed moving to and from a bed to chair (including a wheelchair)?: A Little Help needed walking in hospital room?: A Little Help needed climbing 3-5 steps with a railing? : A Lot 6 Click Score: 17    End of Session Equipment Utilized During Treatment: Gait belt Activity Tolerance: Patient tolerated treatment well Patient left: with call bell/phone within reach;with chair alarm set;with SCD's reapplied;Other (comment);in chair(towel roll under heel, polar care in place)   PT Visit Diagnosis: Unsteadiness on feet (R26.81);Muscle weakness (generalized) (M62.81);Pain Pain - Right/Left: Right Pain - part of body: Knee     Time: 4536-4680 PT Time Calculation (min) (ACUTE ONLY): 24 min  Charges:  $Gait Training: 8-22 mins $Therapeutic Exercise: 8-22 mins                     Caitlin Mendez PT, DPT, GCS    Caitlin Mendez 04/11/2018, 5:28 PM

## 2018-04-12 LAB — CBC
HCT: 36.1 % (ref 35.0–47.0)
HEMOGLOBIN: 12.7 g/dL (ref 12.0–16.0)
MCH: 32.3 pg (ref 26.0–34.0)
MCHC: 35.1 g/dL (ref 32.0–36.0)
MCV: 92.2 fL (ref 80.0–100.0)
Platelets: 168 10*3/uL (ref 150–440)
RBC: 3.92 MIL/uL (ref 3.80–5.20)
RDW: 13.2 % (ref 11.5–14.5)
WBC: 9.6 10*3/uL (ref 3.6–11.0)

## 2018-04-12 MED ORDER — BISACODYL 10 MG RE SUPP
10.0000 mg | Freq: Every day | RECTAL | Status: DC | PRN
  Administered 2018-04-13: 10 mg via RECTAL
  Filled 2018-04-12: qty 1

## 2018-04-12 MED ORDER — HYDROCODONE-ACETAMINOPHEN 7.5-325 MG PO TABS: 1.0000 | ORAL_TABLET | ORAL | 0 refills | Status: AC | PRN

## 2018-04-12 MED ORDER — MAGNESIUM HYDROXIDE 400 MG/5ML PO SUSP
30.0000 mL | Freq: Every day | ORAL | Status: DC
  Administered 2018-04-12: 30 mL via ORAL
  Filled 2018-04-12: qty 30

## 2018-04-12 MED ORDER — ASPIRIN 325 MG PO TBEC: 325.0000 mg | DELAYED_RELEASE_TABLET | Freq: Every day | ORAL | 0 refills | Status: AC

## 2018-04-12 MED ORDER — METHOCARBAMOL 500 MG PO TABS: 500.0000 mg | ORAL_TABLET | Freq: Four times a day (QID) | ORAL | 0 refills | Status: AC | PRN

## 2018-04-12 NOTE — Progress Notes (Addendum)
Physical Therapy Treatment Patient Details Name: Caitlin Mendez MRN: [id number] DOB: [birth year] Today's Date: 04/12/2018    History of Present Illness Pt underwent R TKR without reported post-op complications. PMH includes depression, GERD, low B12, and previous R knee surgery. PT evaluation performed on POD#0. OT evaluation performed on POD#1.    PT Comments    Pt reported pain medication about 2 hours ago.  Remains in pain but wanting to participate.  Participated in exercises as described below.  To edge of bed with min a x 1 for support of RLE.  Stood with min guard/assist +1.  Initially with tremors throughout and was given time to stand and march in place.  Second assist called for safety.  She was able to ambulate with irregular step to pattern with frequent standing rest breaks.  At 34' pt was instructed to sit due to generally unsafe gait and a forward lurch.  After a seated rest, she was able to stand and only able to safely walk 30' before needing to sit again.  Unsafe for stair training this am.  Pt with generally decreased gait quality this morning.  Will recommend +2 assist for PM session for safety.  Discussed with nurse tech.   Follow Up Recommendations  Home health PT     Equipment Recommendations  Rolling walker with 5" wheels;Other (comment);3in1 (PT)    Recommendations for Other Services       Precautions / Restrictions Precautions Precautions: Knee Required Braces or Orthoses: Knee Immobilizer - Right Knee Immobilizer - Right: Discontinue once straight leg raise with < 10 degree lag Restrictions Weight Bearing Restrictions: Yes RLE Weight Bearing: Weight bearing as tolerated    Mobility  Bed Mobility Overal bed mobility: Needs Assistance Bed Mobility: Supine to Sit     Supine to sit: Min assist        Transfers Overall transfer level: Needs assistance Equipment used: Rolling walker (2 wheeled) Transfers: Sit to/from Stand Sit to Stand: Min  guard;Min assist            Ambulation/Gait Ambulation/Gait assistance: Editor, commissioning (Feet): 50 Feet Assistive device: Rolling walker (2 wheeled) Gait Pattern/deviations: Step-to pattern   Gait velocity interpretation: <1.8 ft/sec, indicate of risk for recurrent falls General Gait Details: Decreased gait quality today with forward lurch at times,  Generally unsafe gait with tremors throughout body at times.   Stairs             Wheelchair Mobility    Modified Rankin (Stroke Patients Only)       Balance Overall balance assessment: Needs assistance Sitting-balance support: No upper extremity supported Sitting balance-Leahy Scale: Good     Standing balance support: Bilateral upper extremity supported Standing balance-Leahy Scale: Poor Standing balance comment: heavy reliance on walker with forward lean at times.                            Cognition Arousal/Alertness: Awake/alert Behavior During Therapy: WFL for tasks assessed/performed Overall Cognitive Status: Within Functional Limits for tasks assessed                                        Exercises Other Exercises Other Exercises: supine quad sets, SLR, heel slides, seated LAQ and knee flexion x 10 A/AAROM   ROM 5-77 AAROM limited by pain    General Comments  Pertinent Vitals/Pain Pain Assessment: 0-10 Pain Score: 7  Pain Location: Increased R knee pain today at rest and with activity compared to yesterday Pain Descriptors / Indicators: Aching;Operative site guarding;Guarding;Discomfort Pain Intervention(s): Limited activity within patient's tolerance;Monitored during session;Premedicated before session;Ice applied    Home Living                      Prior Function            PT Goals (current goals can now be found in the care plan section) Progress towards PT goals: Progressing toward goals    Frequency    BID      PT Plan  Current plan remains appropriate    Co-evaluation              AM-PAC PT "6 Clicks" Daily Activity  Outcome Measure  Difficulty turning over in bed (including adjusting bedclothes, sheets and blankets)?: A Little Difficulty moving from lying on back to sitting on the side of the bed? : Unable Difficulty sitting down on and standing up from a chair with arms (e.g., wheelchair, bedside commode, etc,.)?: A Little Help needed moving to and from a bed to chair (including a wheelchair)?: A Little Help needed walking in hospital room?: A Lot Help needed climbing 3-5 steps with a railing? : Total 6 Click Score: 13    End of Session Equipment Utilized During Treatment: Gait belt Activity Tolerance: Patient limited by pain Patient left: in chair;with chair alarm set;with call bell/phone within reach Nurse Communication: Other (comment) Pain - Right/Left: Right Pain - part of body: Knee     Time: 2706-2376 PT Time Calculation (min) (ACUTE ONLY): 27 min  Charges:  $Gait Training: 8-22 mins $Therapeutic Exercise: 8-22 mins                     Danielle Dess, PTA 04/12/18, 9:28 AM

## 2018-04-12 NOTE — Discharge Summary (Signed)
Physician Discharge Summary  Patient ID: Caitlin Mendez MRN: [id number] DOB/AGE: [birth year] 62 y.o.  Admit date: 04/10/2018 Discharge date: 04/13/2018 Admission Diagnoses:  PRIMARY OSTEOARTHRITIS OF RIGHT KNEE   Discharge Diagnoses: Patient Active Problem List   Diagnosis Date Noted  . Status post total knee replacement using cement, right 04/10/2018    Past Medical History:  Diagnosis Date  . Anxiety   . GERD (gastroesophageal reflux disease)   . Hypothyroidism      Transfusion: none   Consultants (if any):   Discharged Condition: Improved  Hospital Course: Caitlin Mendez is an 62 y.o. female who was admitted 04/10/2018 with a diagnosis of right knee osteoarthritis and went to the operating room on 04/10/2018 and underwent the above named procedures.    Surgeries: Procedure(s): TOTAL KNEE ARTHROPLASTY on 04/10/2018 Patient tolerated the surgery well. Taken to PACU where she was stabilized and then transferred to the orthopedic floor.  Started on Aspirin, SCDs, TEDS. Heels elevated on bed with rolled towels. No evidence of DVT. Negative Homan. Physical therapy started on day #1 for gait training and transfer. OT started day #1 for ADL and assisted devices.  Patient's foley was d/c on day #1. Patient's IV was d/c on day #2.  On post op day #3 patient was stable and ready for discharge to home.  Implants: Medacta GMK 3 and right femoral PS component, 2 right tibial component with short stem 14 mm PS insert, 1 patella, all components cemented  DRESSING / WOUND CARE / SHOWERING Please remove provena negative pressure dressing on 04/20/2018 and apply honey comb dressing. Keep dressing clean and dry at all times.   She was given perioperative antibiotics:  Anti-infectives (From admission, onward)   Start     Dose/Rate Route Frequency Ordered Stop   04/10/18 1400  ceFAZolin (ANCEF) IVPB 2g/100 mL premix     2 g 200 mL/hr over 30 Minutes Intravenous Every 6 hours 04/10/18  1103 04/10/18 2121   04/10/18 0553  ceFAZolin (ANCEF) 2-4 GM/100ML-% IVPB    Note to Pharmacy:  Mike Craze   : cabinet override      04/10/18 0553 04/10/18 0730   04/09/18 2145  ceFAZolin (ANCEF) IVPB 2g/100 mL premix     2 g 200 mL/hr over 30 Minutes Intravenous  Once 04/09/18 2143 04/10/18 0740    .  She was given sequential compression devices, early ambulation, and aspirin for DVT prophylaxis.  She benefited maximally from the hospital stay and there were no complications.    Recent vital signs:  Vitals:   04/12/18 1509 04/12/18 1605  BP:  127/74  Pulse: 87 86  Resp:    Temp:  98.6 F (37 C)  SpO2: 96% 95%    Recent laboratory studies:  Lab Results  Component Value Date   HGB 12.7 04/12/2018   HGB 13.0 04/11/2018   HGB 14.9 03/28/2018   Lab Results  Component Value Date   WBC 9.6 04/12/2018   PLT 168 04/12/2018   Lab Results  Component Value Date   INR 1.02 03/28/2018   Lab Results  Component Value Date   NA 138 04/11/2018   K 3.8 04/11/2018   CL 105 04/11/2018   CO2 27 04/11/2018   BUN 13 04/11/2018   CREATININE 0.62 04/11/2018   GLUCOSE 120 (H) 04/11/2018    Discharge Medications:   Allergies as of 04/12/2018      Reactions   Sulfamethoxazole-trimethoprim Hives   Phenytoin Sodium Hives  Medication List    STOP taking these medications   naproxen sodium 220 MG tablet Commonly known as:  ALEVE     TAKE these medications   aspirin 325 MG EC tablet Take 1 tablet (325 mg total) by mouth daily with breakfast. Start taking on:  04/13/2018   esomeprazole 20 MG capsule Commonly known as:  NEXIUM Take 20 mg by mouth daily as needed.   fexofenadine 180 MG tablet Commonly known as:  ALLEGRA Take 180 mg by mouth daily as needed for allergies or rhinitis.   FLUoxetine 40 MG capsule Commonly known as:  PROZAC Take 40 mg by mouth daily.   HYDROcodone-acetaminophen 7.5-325 MG tablet Commonly known as:  NORCO Take 1-2 tablets by mouth  every 4 (four) hours as needed for severe pain (pain score 7-10).   levothyroxine 25 MCG tablet Commonly known as:  SYNTHROID, LEVOTHROID Take 25 mcg by mouth daily.   methocarbamol 500 MG tablet Commonly known as:  ROBAXIN Take 1 tablet (500 mg total) by mouth every 6 (six) hours as needed for muscle spasms.   multivitamin with minerals Tabs tablet Take 1 tablet by mouth daily.            Durable Medical Equipment  (From admission, onward)         Start     Ordered   04/10/18 1103  DME Walker rolling  Once    Question:  Patient needs a walker to treat with the following condition  Answer:  Status post total knee replacement using cement, right   04/10/18 1103   04/10/18 1103  DME 3 n 1  Once     04/10/18 1103   04/10/18 1103  DME Bedside commode  Once    Question:  Patient needs a bedside commode to treat with the following condition  Answer:  Status post total knee replacement using cement, right   04/10/18 1103          Diagnostic Studies: Dg Knee 1-2 Views Right  Result Date: 04/10/2018 CLINICAL DATA:  Postop right knee replacement EXAM: RIGHT KNEE - 1-2 VIEW COMPARISON:  Right knee films of 08/24/2014 FINDINGS: The femoral and tibial components of the right total knee replacement appear to be in good position and normally aligned. No complicating features are seen. Some air is noted in the soft tissues and joint space postoperatively. Soft tissue swelling is noted medially. IMPRESSION: Right total knee replacement components in good position with no complicating features noted. Electronically Signed   By: Dwyane Dee M.D.   On: 04/10/2018 09:49    Disposition:     Follow-up Information    Caitlin Slack, PA-C Follow up in 2 week(s).   Specialties:  Orthopedic Surgery, Emergency Medicine Contact information: [address] [place] [address] [phone]            Signed: Patience Mendez 04/12/2018, 10:07 PM

## 2018-04-12 NOTE — Progress Notes (Signed)
   Subjective: 2 Days Post-Op Procedure(s) (LRB): TOTAL KNEE ARTHROPLASTY (Right) Patient reports pain as 6 on 0-10 scale.   Patient with no complaints this am.  Denies any CP, SOB, ABD pain. We will continue therapy today.  Plan is to go Home after hospital stay.  Objective: Vital signs in last 24 hours: Temp:  [98.5 F (36.9 C)-99.1 F (37.3 C)] 98.6 F (37 C) (09/05 0746) Pulse Rate:  [73-85] 84 (09/05 0746) Resp:  [12] 12 (09/04 2301) BP: (97-149)/(64-81) 140/77 (09/05 0746) SpO2:  [94 %-97 %] 94 % (09/05 0746)  Intake/Output from previous day: 09/04 0701 - 09/05 0700 In: 1068.9 [P.O.:240; I.V.:828.9] Out: 750 [Urine:750] Intake/Output this shift: No intake/output data recorded.  Recent Labs    04/11/18 0335 04/12/18 0323  HGB 13.0 12.7   Recent Labs    04/11/18 0335 04/12/18 0323  WBC 8.5 9.6  RBC 4.06 3.92  HCT 37.3 36.1  PLT 172 168   Recent Labs    04/11/18 0335  NA 138  K 3.8  CL 105  CO2 27  BUN 13  CREATININE 0.62  GLUCOSE 120*  CALCIUM 8.3*   No results for input(s): LABPT, INR in the last 72 hours.  EXAM General - Patient is Alert, Appropriate and Oriented Extremity - Neurovascular intact Sensation intact distally Intact pulses distally Dorsiflexion/Plantar flexion intact No cellulitis present Compartment soft Dressing - dressing C/D/I and no drainage, wound vac intact Motor Function - intact, moving foot and toes well on exam.   Past Medical History:  Diagnosis Date  . Anxiety   . GERD (gastroesophageal reflux disease)   . Hypothyroidism     Assessment/Plan:   2 Days Post-Op Procedure(s) (LRB): TOTAL KNEE ARTHROPLASTY (Right) Active Problems:   Status post total knee replacement using cement, right  Estimated body mass index is 36.4 kg/m as calculated from the following:   Height as of this encounter: 5\' 4"  (1.626 m).   Weight as of this encounter: 96.2 kg. Advance diet Up with therapy  Needs BM Labs stable CM to  assist with discharge to home with HHPT tomorrow  DVT Prophylaxis - Aspirin, TED hose and SCDs Weight-Bearing as tolerated to right leg   T. Cranston Neighbor, PA-C [facility] 04/12/2018, 8:06 AM

## 2018-04-12 NOTE — Care Management (Signed)
Outpatient PT orders received and faxed to [facility] [phone]. Met with patient and she expresses concern with returning to home. 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  I have provided her with private duty agency list.

## 2018-04-12 NOTE — Discharge Instructions (Signed)
° °TOTAL KNEE REPLACEMENT POSTOPERATIVE DIRECTIONS ° °Knee Rehabilitation, Guidelines Following Surgery  °Results after knee surgery are often greatly improved when you follow the exercise, range of motion and muscle strengthening exercises prescribed by your doctor. Safety measures are also important to protect the knee from further injury. Any time any of these exercises cause you to have increased pain or swelling in your knee joint, decrease the amount until you are comfortable again and slowly increase them. If you have problems or questions, call your caregiver or physical therapist for advice.  ° °HOME CARE INSTRUCTIONS  °Remove items at home which could result in a fall. This includes throw rugs or furniture in walking pathways.  °· Continue to use polar care unit on the knee for pain and swelling from surgery. You may notice swelling that will progress down to the foot and ankle.  This is normal after surgery.  Elevate the leg when you are not up walking on it.   °· Continue to use the breathing machine which will help keep your temperature down.  It is common for your temperature to cycle up and down following surgery, especially at night when you are not up moving around and exerting yourself.  The breathing machine keeps your lungs expanded and your temperature down. °· Do not place pillow under knee, focus on keeping the knee STRAIGHT while resting ° °DIET °You may resume your previous home diet once your are discharged from the hospital. ° °DRESSING / WOUND CARE / SHOWERING °Please remove provena negative pressure dressing on 04/20/2018 and apply honey comb dressing. Keep dressing clean and dry at all times.  ° ° °ACTIVITY °Walk with your walker as instructed. °Use walker as long as suggested by your caregivers. °Avoid periods of inactivity such as sitting longer than an hour when not asleep. This helps prevent blood clots.  °You may resume a sexual relationship in one month or when given the OK by  your doctor.  °You may return to work once you are cleared by your doctor.  °Do not drive a car for 6 weeks or until released by you surgeon.  °Do not drive while taking narcotics. ° °WEIGHT BEARING °Weight bearing as tolerated with assist device (walker, cane, etc) as directed, use it as long as suggested by your surgeon or therapist, typically at least 4-6 weeks. ° °POSTOPERATIVE CONSTIPATION PROTOCOL °Constipation - defined medically as fewer than three stools per week and severe constipation as less than one stool per week. ° °One of the most common issues patients have following surgery is constipation.  Even if you have a regular bowel pattern at home, your normal regimen is likely to be disrupted due to multiple reasons following surgery.  Combination of anesthesia, postoperative narcotics, change in appetite and fluid intake all can affect your bowels.  In order to avoid complications following surgery, here are some recommendations in order to help you during your recovery period. ° °Colace (docusate) - Pick up an over-the-counter form of Colace or another stool softener and take twice a day as long as you are requiring postoperative pain medications.  Take with a full glass of water daily.  If you experience loose stools or diarrhea, hold the colace until you stool forms back up.  If your symptoms do not get better within 1 week or if they get worse, check with your doctor. ° °Dulcolax (bisacodyl) - Pick up over-the-counter and take as directed by the product packaging as needed to assist with the   movement of your bowels.  Take with a full glass of water.  Use this product as needed if not relieved by Colace only.  ° °MiraLax (polyethylene glycol) - Pick up over-the-counter to have on hand.  MiraLax is a solution that will increase the amount of water in your bowels to assist with bowel movements.  Take as directed and can mix with a glass of water, juice, soda, coffee, or tea.  Take if you go more than  two days without a movement. °Do not use MiraLax more than once per day. Call your doctor if you are still constipated or irregular after using this medication for 7 days in a row. ° °If you continue to have problems with postoperative constipation, please contact the office for further assistance and recommendations.  If you experience "the worst abdominal pain ever" or develop nausea or vomiting, please contact the office immediatly for further recommendations for treatment. ° °ITCHING ° If you experience itching with your medications, try taking only a single pain pill, or even half a pain pill at a time.  You can also use Benadryl over the counter for itching or also to help with sleep.  ° °TED HOSE STOCKINGS °Wear the elastic stockings on both legs for six weeks following surgery during the day but you may remove then at night for sleeping. ° °MEDICATIONS °See your medication summary on the “After Visit Summary” that the nursing staff will review with you prior to discharge.  You may have some home medications which will be placed on hold until you complete the course of blood thinner medication.  It is important for you to complete the blood thinner medication as prescribed by your surgeon.  Continue your approved medications as instructed at time of discharge. ° °PRECAUTIONS °If you experience chest pain or shortness of breath - call 911 immediately for transfer to the hospital emergency department.  °If you develop a fever greater that 101 F, purulent drainage from wound, increased redness or drainage from wound, foul odor from the wound/dressing, or calf pain - CONTACT YOUR SURGEON.   °                                                °FOLLOW-UP APPOINTMENTS °Make sure you keep all of your appointments after your operation with your surgeon and caregivers. You should call the office at the above phone number and make an appointment for approximately two weeks after the date of your surgery or on the date  instructed by your surgeon outlined in the "After Visit Summary". ° ° °RANGE OF MOTION AND STRENGTHENING EXERCISES  °Rehabilitation of the knee is important following a knee injury or an operation. After just a few days of immobilization, the muscles of the thigh which control the knee become weakened and shrink (atrophy). Knee exercises are designed to build up the tone and strength of the thigh muscles and to improve knee motion. Often times heat used for twenty to thirty minutes before working out will loosen up your tissues and help with improving the range of motion but do not use heat for the first two weeks following surgery. These exercises can be done on a training (exercise) mat, on the floor, on a table or on a bed. Use what ever works the best and is most comfortable for you Knee exercises include:  °  Leg Lifts - While your knee is still immobilized in a splint or cast, you can do straight leg raises. Lift the leg to 60 degrees, hold for 3 sec, and slowly lower the leg. Repeat 10-20 times 2-3 times daily. Perform this exercise against resistance later as your knee gets better.  °Quad and Hamstring Sets - Tighten up the muscle on the front of the thigh (Quad) and hold for 5-10 sec. Repeat this 10-20 times hourly. Hamstring sets are done by pushing the foot backward against an object and holding for 5-10 sec. Repeat as with quad sets.  °· Leg Slides: Lying on your back, slowly slide your foot toward your buttocks, bending your knee up off the floor (only go as far as is comfortable). Then slowly slide your foot back down until your leg is flat on the floor again. °· Angel Wings: Lying on your back spread your legs to the side as far apart as you can without causing discomfort.  °A rehabilitation program following serious knee injuries can speed recovery and prevent re-injury in the future due to weakened muscles. Contact your doctor or a physical therapist for more information on knee rehabilitation.  ° °IF  YOU ARE TRANSFERRED TO A SKILLED REHAB FACILITY °If the patient is transferred to a skilled rehab facility following release from the hospital, a list of the current medications will be sent to the facility for the patient to continue.  When discharged from the skilled rehab facility, please have the facility set up the patient's Home Health Physical Therapy prior to being released. Also, the skilled facility will be responsible for providing the patient with their medications at time of release from the facility to include their pain medication, the muscle relaxants, and their blood thinner medication. If the patient is still at the rehab facility at time of the two week follow up appointment, the skilled rehab facility will also need to assist the patient in arranging follow up appointment in our office and any transportation needs. ° °MAKE SURE YOU:  °Understand these instructions.  °Get help right away if you are not doing well or get worse.  ° ° ° ° °

## 2018-04-12 NOTE — Care Management (Addendum)
Late note entry: This RNCM received notification from previous RNCM that she has not been able to find a home health agency that will take patient's insurance or the address she plans to go to at discharge: [address] [place], [place] [address].  This RNCM has reached out to [facility] (said no), [facility] (said no), [facility] (said no), [facility] (said no), and [facility] (said no). Back up plan per patient's request: [facility] [phone] fax [phone]. Order requested from [facility]- order received. Patient will need a front-wheeled walker which has been requested from [facility] with [facility]. Patient updated.

## 2018-04-12 NOTE — Progress Notes (Addendum)
Physical Therapy Treatment Patient Details Name: Caitlin Mendez MRN: [id number] DOB: [birth year] Today's Date: 04/12/2018    History of Present Illness Pt underwent R TKR without reported post-op complications. PMH includes depression, GERD, low B12, and previous R knee surgery. PT evaluation performed on POD#0. OT evaluation performed on POD#1.    PT Comments    Pt agreeable to PT; reports 6/10 R knee pain post medication 1 hour ago. Pt anxious and attempts to perform mobility throughout with a quick pace. Increased instruction and cueing throughout for slower pace and mindfulness about technique for all mobility. Ultimately bed mobility with Min A to return to bed (mod I supine to sit), Min A STS (with one episode requiring Mod A for uncontrolled descent) and Min guard with sequence and step length cues for ambulation. Pt demonstrating improved steadiness with short ambulation with cueing, slower pace and mindful QS with weight bearing on R as well as increased use of UEs to de weight RLE; however, pt fatigues quickly and tends to become more anxious/unsafe at these times. Pt has 5 STE and 10 STE house depending on door used; foresee this as being a significant barrier to getting home. Concerned also regarding pt's safety awareness skills with mobility that pt may be at an increased risk of falling at home. Based on today's session's with limited ambulation, poor safety with mobility and barrier of numerous STE the home recommendation changed to skilled nursing facility placement post acute care stay to allow for improvement in all area prior to returning home. SW updated via Team notes.   Follow Up Recommendations   Skilled Nursing Facility     Equipment Recommendations       Recommendations for Other Services       Precautions / Restrictions Precautions Precautions: Knee;Fall Restrictions Weight Bearing Restrictions: Yes RLE Weight Bearing: Weight bearing as tolerated     Mobility  Bed Mobility Overal bed mobility: Modified Independent Bed Mobility: Supine to Sit;Sit to Supine     Supine to sit: Modified independent (Device/Increase time);Min assist Sit to supine: Min assist   General bed mobility comments: Slow, but able to self assist RLE off edge of bed with instruction. Requires A to return RLE to bed  Transfers Overall transfer level: Needs assistance Equipment used: Rolling walker (2 wheeled) Transfers: Sit to/from Stand Sit to Stand: Min assist;Mod assist         General transfer comment: Education on proper hand use and wt shift to facilitate transfer. Min A to stand; initial sit post 28 ft walk, pt becomes eager and impulsive with sit losing balance requiring Mod A to sit to edge of bed. Improved with cues second time post walk  Ambulation/Gait Ambulation/Gait assistance: Min guard Gait Distance (Feet): 28 Feet(second walk 34 feet) Assistive device: Rolling walker (2 wheeled) Gait Pattern/deviations: Step-to pattern;Antalgic Gait velocity: decreased Gait velocity interpretation: <1.31 ft/sec, indicative of household ambulator General Gait Details: cues for sequence, step lengths and using of UE to de weight   Stairs             Wheelchair Mobility    Modified Rankin (Stroke Patients Only)       Balance Overall balance assessment: Needs assistance Sitting-balance support: Feet supported Sitting balance-Leahy Scale: Good     Standing balance support: Bilateral upper extremity supported Standing balance-Leahy Scale: Fair  Cognition Arousal/Alertness: Awake/alert Behavior During Therapy: WFL for tasks assessed/performed;Anxious Overall Cognitive Status: Within Functional Limits for tasks assessed                                        Exercises Total Joint Exercises Quad Sets: Strengthening;Both;10 reps;Supine;Standing(1 set of 10 in stand prior to each  walk) Straight Leg Raises: Strengthening;Right;10 reps;Supine(small range) Knee Flexion: AROM;Right;10 reps;Seated(3 positions each rep with 10 sec hold ea)    General Comments        Pertinent Vitals/Pain Pain Assessment: 0-10 Pain Score: 6  Pain Location: R knee Pain Descriptors / Indicators: Aching;Constant;Discomfort;Grimacing;Moaning;Operative site guarding Pain Intervention(s): Monitored during session;Premedicated before session;Ice applied    Home Living                      Prior Function            PT Goals (current goals can now be found in the care plan section) Progress towards PT goals: Progressing toward goals    Frequency    BID      PT Plan Current plan remains appropriate    Co-evaluation              AM-PAC PT "6 Clicks" Daily Activity  Outcome Measure  Difficulty turning over in bed (including adjusting bedclothes, sheets and blankets)?: A Little Difficulty moving from lying on back to sitting on the side of the bed? : Unable Difficulty sitting down on and standing up from a chair with arms (e.g., wheelchair, bedside commode, etc,.)?: Unable Help needed moving to and from a bed to chair (including a wheelchair)?: A Little Help needed walking in hospital room?: A Little Help needed climbing 3-5 steps with a railing? : A Lot 6 Click Score: 13    End of Session Equipment Utilized During Treatment: Gait belt Activity Tolerance: Patient limited by pain Patient left: in bed;with call bell/phone within reach;with bed alarm set;with SCD's reapplied(polar care in place)   PT Visit Diagnosis: Unsteadiness on feet (R26.81);Muscle weakness (generalized) (M62.81);Pain Pain - Right/Left: Right Pain - part of body: Knee     Time: 4540-9811 PT Time Calculation (min) (ACUTE ONLY): 42 min  Charges:  $Gait Training: 8-22 mins $Therapeutic Exercise: 8-22 mins $Therapeutic Activity: 8-22 mins                      Scot Dock,  PTA 04/12/2018, 4:09 PM

## 2018-04-12 NOTE — Progress Notes (Signed)
Occupational Therapy Treatment Patient Details Name: Caitlin Mendez MRN: [id number] DOB: Oct 07, 1955 Today's Date: 04/12/2018    History of present illness Pt underwent R TKR without reported post-op complications. PMH includes depression, GERD, low B12, and previous R knee surgery. PT evaluation performed on POD#0. OT evaluation performed on POD#1.   OT comments  Pt seen for OT tx this date. Pt up in recliner, sleepy, but agreeable to OT. Pt instructed in AE for LB dressing tasks to maximize independence and reviewed compression stocking mgt education provided previous date to maximize recall and carryover. Pt verbalized understanding but politely declined to trial herself. Pt continues to be limited by pain. Will continue to progress while here. Continue to recommend HHOT services, pending better pain control.    Follow Up Recommendations  Home health OT    Equipment Recommendations  3 in 1 bedside commode;Tub/shower bench;Other (comment)(reacher, sock aide)    Recommendations for Other Services      Precautions / Restrictions Precautions Precautions: Knee Required Braces or Orthoses: Knee Immobilizer - Right Knee Immobilizer - Right: Discontinue once straight leg raise with < 10 degree lag Restrictions Weight Bearing Restrictions: Yes RLE Weight Bearing: Weight bearing as tolerated       Mobility Bed Mobility     General bed mobility comments: deferred, up in recliner  Transfers Overall transfer level: Needs assistance Equipment used: Rolling walker (2 wheeled) Transfers: Sit to/from Stand Sit to Stand: Min guard;Min assist              Balance Overall balance assessment: Needs assistance Sitting-balance support: No upper extremity supported Sitting balance-Leahy Scale: Good     Standing balance support: Bilateral upper extremity supported Standing balance-Leahy Scale: Poor Standing balance comment: heavy reliance on walker with forward lean at times.                           ADL either performed or assessed with clinical judgement   ADL Overall ADL's : Needs assistance/impaired                       Lower Body Dressing Details (indicate cue type and reason): pt instructed in AE for LB dressing tasks to maximize independence and reviewed compression stocking mgt education provided previous date to maximize recall and carryover. Pt verbalized understanding but politely declined to trial herself.                     Vision Baseline Vision/History: Wears glasses Wears Glasses: Reading only;Distance only Patient Visual Report: No change from baseline     Perception     Praxis      Cognition Arousal/Alertness: Suspect due to medications Behavior During Therapy: WFL for tasks assessed/performed Overall Cognitive Status: Within Functional Limits for tasks assessed                                 General Comments: pt very sleepy/groggy, difficulty keeping eyes open        Exercises    Shoulder Instructions       General Comments      Pertinent Vitals/ Pain       Pain Assessment: 0-10 Pain Score: 7  Pain Location: R knee Pain Descriptors / Indicators: Aching;Operative site guarding;Guarding;Discomfort Pain Intervention(s): Limited activity within patient's tolerance;Monitored during session;Premedicated before session;Repositioned;Ice applied  Home Living  Prior Functioning/Environment              Frequency  Min 1X/week        Progress Toward Goals  OT Goals(current goals can now be found in the care plan section)  Progress towards OT goals: Progressing toward goals  Acute Rehab OT Goals Patient Stated Goal: have less pain and return to prior level of function OT Goal Formulation: With patient Time For Goal Achievement: 04/25/18 Potential to Achieve Goals: Good  Plan Discharge plan remains  appropriate;Frequency remains appropriate    Co-evaluation                 AM-PAC PT "6 Clicks" Daily Activity     Outcome Measure   Help from another person eating meals?: None Help from another person taking care of personal grooming?: None Help from another person toileting, which includes using toliet, bedpan, or urinal?: A Little Help from another person bathing (including washing, rinsing, drying)?: A Little Help from another person to put on and taking off regular upper body clothing?: None Help from another person to put on and taking off regular lower body clothing?: A Little 6 Click Score: 21    End of Session    OT Visit Diagnosis: Other abnormalities of gait and mobility (R26.89);Pain Pain - Right/Left: Right Pain - part of body: Knee;Hip;Leg   Activity Tolerance Patient tolerated treatment well   Patient Left in chair;with call bell/phone within reach;with chair alarm set;with SCD's reapplied;Other (comment)(polar care in place)   Nurse Communication          Time: 1610-9604 OT Time Calculation (min): 10 min  Charges: OT General Charges $OT Visit: 1 Visit OT Treatments $Self Care/Home Management : 8-22 mins  Richrd Prime, MPH, MS, OTR/L ascom [phone] 04/12/18, 10:47 AM

## 2018-04-13 NOTE — Plan of Care (Signed)

## 2018-04-13 NOTE — Progress Notes (Signed)
   Subjective: 3 Days Post-Op Procedure(s) (LRB): TOTAL KNEE ARTHROPLASTY (Right) Patient reports pain as mild.   Patient with no complaints this am.  Denies any CP, SOB, ABD pain. We will continue therapy today.  Plan is to go Home after hospital stay.  Objective: Vital signs in last 24 hours: Temp:  [98.6 F (37 C)] 98.6 F (37 C) (09/06 0126) Pulse Rate:  [72-87] 72 (09/06 0126) Resp:  [18] 18 (09/06 0126) BP: (118-140)/(69-77) 118/69 (09/06 0126) SpO2:  [94 %-98 %] 98 % (09/06 0126)  Intake/Output from previous day: No intake/output data recorded. Intake/Output this shift: No intake/output data recorded.  Recent Labs    04/11/18 0335 04/12/18 0323  HGB 13.0 12.7   Recent Labs    04/11/18 0335 04/12/18 0323  WBC 8.5 9.6  RBC 4.06 3.92  HCT 37.3 36.1  PLT 172 168   Recent Labs    04/11/18 0335  NA 138  K 3.8  CL 105  CO2 27  BUN 13  CREATININE 0.62  GLUCOSE 120*  CALCIUM 8.3*   No results for input(s): LABPT, INR in the last 72 hours.  EXAM General - Patient is Alert, Appropriate and Oriented Extremity - Neurovascular intact Sensation intact distally Intact pulses distally Dorsiflexion/Plantar flexion intact No cellulitis present Compartment soft Dressing - dressing C/D/I and no drainage, wound vac intact Motor Function - intact, moving foot and toes well on exam.   Past Medical History:  Diagnosis Date  . Anxiety   . GERD (gastroesophageal reflux disease)   . Hypothyroidism     Assessment/Plan:   3 Days Post-Op Procedure(s) (LRB): TOTAL KNEE ARTHROPLASTY (Right) Active Problems:   Status post total knee replacement using cement, right  Estimated body mass index is 36.4 kg/m as calculated from the following:   Height as of this encounter: 5\' 4"  (1.626 m).   Weight as of this encounter: 96.2 kg. Advance diet Up with therapy  CM to assist with discharge to home today after physical therapy. Follow-up with [facility] in 2  weeks.  DVT Prophylaxis - Aspirin, TED hose and SCDs Weight-Bearing as tolerated to right leg   T. Cranston Neighbor, PA-C [facility] 04/13/2018, 7:35 AM

## 2018-04-13 NOTE — Clinical Social Work Placement (Signed)
   CLINICAL SOCIAL WORK PLACEMENT  NOTE  Date:  04/13/2018  Patient Details  Name: Caitlin Mendez MRN: [id number] Date of Birth: 1956/07/10  Clinical Social Work is seeking post-discharge placement for this patient at the Skilled  Nursing Facility level of care (*CSW will initial, date and re-position this form in  chart as items are completed):  Yes   Patient/family provided with [place] Clinical Social Work Department's list of facilities offering this level of care within the geographic area requested by the patient (or if unable, by the patient's family).  Yes   Patient/family informed of their freedom to choose among providers that offer the needed level of care, that participate in Medicare, Medicaid or managed care program needed by the patient, have an available bed and are willing to accept the patient.  Yes   Patient/family informed of [facility]'s ownership interest in [facility] and [facility], as well as of the fact that they are under no obligation to receive care at these facilities.  PASRR submitted to EDS on 04/10/18     PASRR number received on 04/10/18     Existing PASRR number confirmed on       FL2 transmitted to all facilities in geographic area requested by pt/family on 04/13/18     FL2 transmitted to all facilities within larger geographic area on       Patient informed that his/her managed care company has contracts with or will negotiate with certain facilities, including the following:        Yes   Patient/family informed of bed offers received.  Patient chooses bed at [facility] )     Physician recommends and patient chooses bed at      Patient to be transferred to   on  .  Patient to be transferred to facility by       Patient family notified on   of transfer.  Name of family member notified:        PHYSICIAN       Additional Comment:    _______________________________________________ Tobiah Celestine, Darleen Crocker,  LCSW 04/13/2018, 9:23 AM

## 2018-04-13 NOTE — Progress Notes (Signed)
Patient ready for discharge to  house. Pt will be transported in car by her brother. Reviewed. All discharge instructions and f/u appointments.

## 2018-04-13 NOTE — Clinical Social Work Note (Signed)
Clinical Social Work Assessment  Patient Details  Name: Caitlin Mendez MRN: [id number] Date of Birth: Oct 05, 1955  Date of referral:  04/13/18               Reason for consult:  Facility Placement                Permission sought to share information with:  Caitlin Mendez granted to share information::  Yes, Verbal Permission Granted  Name::      Caitlin Mendez::   [place]   Relationship::     Contact Information:     Housing/Transportation Living arrangements for the past 2 months:  Muse of Information:  Patient Patient Interpreter Needed:  None Criminal Activity/Legal Involvement Pertinent to Current Situation/Hospitalization:  No - Comment as needed Significant Relationships:  Parents Lives with:  Self Do you feel safe going back to the place where you live?  Yes Need for family participation in patient care:  Yes (Comment)  Care giving concerns:  Patient lives alone in [place] however she is going to stay with her father in [place] after she leaves [place].     Social Worker assessment / plan:  Holiday representative (CSW) received consult from Chief Strategy Officer that PT has changed recommendation to SNF. PT has been recommending home health. Patient is post op day 3 and stable for discharge today. CSW met with patient to discuss options. While CSW was in the room patient walked from the bathroom to the bed and got herself in the bed with no assistance. She did use a walker. Patient was alert and oriented X4 and was laying in the bed. CSW introduced self and explained role of CSW department. CSW explained that patient's insurance Christella Scheuermann will likely not pay for SNF. CSW explained that there are not many facilities in network with [place]. CSW explained that we can try to get [place] authorization however they may take a few days to make a decision. Patient is agreeable to SNF search in [facility] and  verbalized her understanding. FL2 complete and faxed out.   CSW presented bed offers to patient and she chose [facility]. CSW stated that [place] will start [facility] authorization today and will likely not hear back. Patient is agreeable to D/C home today to her father's house in [facility] and Caitlin Mendez will follow up with her at home about [employer] determination. Patient's cell # [phone]) [phone]. Per [facility] admissions coordinator at [facility]endez she will start [facility] authorization today and follow up with patient at home.   Plan is for patient to D/C home today and [place] will follow up with her at home unless [facility]lin Mendez can get [place] authorization today. RN case Freight forwarder and RN aware of above.   CSW received a call from [facility] case manager Caitlin Mendez stating that she will review SNF authorization today once [facility] starts it. Claiborne Billings at [facility] is aware of above.   Employment status:  Disabled (Comment on whether or not currently receiving Disability) Insurance information:  Managed Care PT Recommendations:  [place], Home with [place] / Referral to community resources:  [place]  Patient/Family's Response to care:  Patient is agreeable to go to [facility] if [place] approves it.   Patient/Family's Understanding of and Emotional Response to Diagnosis, Current Treatment, and Prognosis:  Patient was very pleasant and thanked CSW for assistance.  Emotional Assessment Appearance:  Appears stated age Attitude/Demeanor/Rapport:    Affect (typically observed):  Accepting, Adaptable, Pleasant Orientation:  Oriented to Self, Oriented to Place, Oriented to  Time, Oriented to Situation Alcohol / Substance use:  Not Applicable Psych involvement (Current and /or in the community):  No (Comment)  Discharge Needs  Concerns to be addressed:  Discharge Planning  Concerns Readmission within the last 30 days:  No Current discharge risk:  Dependent with Mobility Barriers to Discharge:  No Barriers Identified   Caitlin Mendez, Caitlin Beets, LCSW 04/13/2018, 9:24 AM

## 2018-04-13 NOTE — Progress Notes (Signed)
[facility] SNF authorization has been received. Patient is medically stable for D/C to [facility] today. Per [facility] admissions coordinator at [facility] patient can come today. RN will call report and patient reported that her family member will transport. Clinical Child psychotherapist (CSW) sent D/C orders to [facility] via [place]. Patient is aware of above. Please reconsult if future social work needs arise. CSW signing off.   [employer], LCSW [phone]

## 2018-04-13 NOTE — Progress Notes (Signed)
Physical Therapy Treatment Patient Details Name: Caitlin Mendez MRN: [id number] DOB: [birth year] Today's Date: 04/13/2018    History of Present Illness Pt underwent R TKR without reported post-op complications. PMH includes depression, GERD, low B12, and previous R knee surgery. PT evaluation performed on POD#0. OT evaluation performed on POD#1.    PT Comments    Patient agreeable to PT at start of session, R knee pain 4/10. Patient able to complete therapeutic exercises with verbal cues from PT and extended time due to pain. Mobilized to EOB mod I and increased time as well. Sit <> stand with CGA, RW and elevated bed surface, demonstrated proper sequencing and hand placement during transfer. Ambulated ~37ft with RW and CGA, minimal complaints of dizziness and several standing rest breaks needed. Moderate antalgic gait on RLE, decreased stride length, weight bearing, and gait velocity with step to gait pattern. Patient in chair at end of session with all needs in reach. At this time patient reports if discharged home, she plans to stay with her father who is blind and has family that will be able to check on them. Current recommendation is appropriate due to decreased care giver support, continued elevated pain, and inaccessibility of patients own home due to many stairs.      Follow Up Recommendations  SNF     Equipment Recommendations  Rolling walker with 5" wheels;Other (comment);3in1 (PT)    Recommendations for Other Services       Precautions / Restrictions Precautions Precautions: Knee;Fall Precaution Booklet Issued: Yes (comment) Restrictions Weight Bearing Restrictions: Yes RLE Weight Bearing: Weight bearing as tolerated    Mobility  Bed Mobility Overal bed mobility: Modified Independent Bed Mobility: Supine to Sit;Sit to Supine     Supine to sit: Modified independent (Device/Increase time);Min assist     General bed mobility comments: Slow but able to perform mod  I. Self assist of RLE off edge of bed.  Transfers Overall transfer level: Needs assistance Equipment used: Rolling walker (2 wheeled) Transfers: Sit to/from Stand Sit to Stand: Min guard;From elevated surface            Ambulation/Gait Ambulation/Gait assistance: Min guard Gait Distance (Feet): 80 Feet Assistive device: Rolling walker (2 wheeled) Gait Pattern/deviations: Step-to pattern;Antalgic Gait velocity: decreased   General Gait Details: occasional standing rest breaks needed   Stairs             Wheelchair Mobility    Modified Rankin (Stroke Patients Only)       Balance Overall balance assessment: Needs assistance Sitting-balance support: Feet supported Sitting balance-Leahy Scale: Good                                      Cognition Arousal/Alertness: Awake/alert Behavior During Therapy: WFL for tasks assessed/performed;Anxious Overall Cognitive Status: Within Functional Limits for tasks assessed                                        Exercises Total Joint Exercises Ankle Circles/Pumps: AROM;Both;20 reps Quad Sets: AROM;Both;20 reps Gluteal Sets: AROM;Both;20 reps Short Arc Quad: AROM;Right;15 reps Heel Slides: AROM;Right;15 reps Straight Leg Raises: AROM;Right;10 reps Long Arc Quad: AROM;Right;5 reps    General Comments        Pertinent Vitals/Pain Pain Assessment: 0-10 Pain Score: 4  Pain Location: R knee Pain Descriptors /  Indicators: Aching;Sharp;Sore;Moaning Pain Intervention(s): Limited activity within patient's tolerance;Monitored during session;Ice applied;Repositioned    Home Living                      Prior Function            PT Goals (current goals can now be found in the care plan section) Progress towards PT goals: Progressing toward goals    Frequency    BID      PT Plan Current plan remains appropriate    Co-evaluation              AM-PAC PT "6 Clicks"  Daily Activity  Outcome Measure  Difficulty turning over in bed (including adjusting bedclothes, sheets and blankets)?: A Little Difficulty moving from lying on back to sitting on the side of the bed? : Unable Difficulty sitting down on and standing up from a chair with arms (e.g., wheelchair, bedside commode, etc,.)?: Unable Help needed moving to and from a bed to chair (including a wheelchair)?: A Little Help needed walking in hospital room?: A Little Help needed climbing 3-5 steps with a railing? : A Lot 6 Click Score: 13    End of Session Equipment Utilized During Treatment: Gait belt Activity Tolerance: Patient tolerated treatment well Patient left: with chair alarm set;in chair;with call bell/phone within reach;with SCD's reapplied(heels elevated) Nurse Communication: Mobility status PT Visit Diagnosis: Unsteadiness on feet (R26.81);Muscle weakness (generalized) (M62.81);Pain Pain - Right/Left: Right Pain - part of body: Knee     Time: 1127-1200 PT Time Calculation (min) (ACUTE ONLY): 33 min  Charges:  $Therapeutic Exercise: 8-22 mins $Therapeutic Activity: 8-22 mins                     Olga Coaster PT, DPT 1:07 PM,04/13/18 [phone]

## 2018-04-13 NOTE — Care Management (Signed)
RNCM spoke with patient regarding transition to home today. She states she doesn't feel safe. PT is recommending SNF at this time.  Dr. Rosita Kea is with patient now. RNCM will follow.

## 2018-04-13 NOTE — Clinical Social Work Placement (Signed)
   CLINICAL SOCIAL WORK PLACEMENT  NOTE  Date:  04/13/2018  Patient Details  Name: Caitlin Mendez MRN: [id number] Date of Birth: 1956/04/07  Clinical Social Work is seeking post-discharge placement for this patient at the Skilled  Nursing Facility level of care (*CSW will initial, date and re-position this form in  chart as items are completed):  Yes   Patient/family provided with [place] Clinical Social Work Department's list of facilities offering this level of care within the geographic area requested by the patient (or if unable, by the patient's family).  Yes   Patient/family informed of their freedom to choose among providers that offer the needed level of care, that participate in Medicare, Medicaid or managed care program needed by the patient, have an available bed and are willing to accept the patient.  Yes   Patient/family informed of [facility]'s ownership interest in [facility] and [facility], as well as of the fact that they are under no obligation to receive care at these facilities.  PASRR submitted to EDS on 04/10/18     PASRR number received on 04/10/18     Existing PASRR number confirmed on       FL2 transmitted to all facilities in geographic area requested by pt/family on 04/13/18     FL2 transmitted to all facilities within larger geographic area on       Patient informed that his/her managed care company has contracts with or will negotiate with certain facilities, including the following:        Yes   Patient/family informed of bed offers received.  Patient chooses bed at [facility] )     Physician recommends and patient chooses bed at      Patient to be transferred to [facility] ) on 04/13/18.  Patient to be transferred to facility by (Per patient her family member will transport her. )     Patient family notified on 04/13/18 of transfer.  Name of family member notified:  (Per patient her family member is aware  of D/C today. )     PHYSICIAN       Additional Comment:    _______________________________________________ Latrease Kunde, Darleen Crocker, LCSW 04/13/2018, 12:26 PM

## 2018-04-13 NOTE — Progress Notes (Signed)
PT Cancellation Note  Patient Details Name: Caitlin Mendez MRN: [id number] DOB: Apr 20, 1956   Cancelled Treatment:    Reason Eval/Treat Not Completed: Other (comment)(Patient eating breakfast at start of session. Requested PT to return later in the AM. PT will follow up as able.)   Olga Coaster PT, DPT 9:03 AM,04/13/18  [phone]

## 2019-09-29 IMAGING — CT CT KNEE*R* W/O CM
3 of 8 series · 10 of 33 positions shown, 11 images · non-contrast
Comparison: Radiographs 08/24/2014.

CLINICAL DATA: Right knee pain and swelling for 1 year. History of
osteoarthritis with planned total knee arthroplasty. History of
arthroscopic surgery.

EXAM:
CT OF THE RIGHT KNEE WITHOUT CONTRAST
TECHNIQUE: Multidetector CT imaging of the right knee was performed according
to the standard protocol. Multiplanar CT image reconstructions were
also generated.

[Series 3: axial bone knee · axial · 0.34mm/px · z∈[+1014,+1198]mm · 4 of 308 slices shown, 5 images]
[im 62/308  soft-tissue]
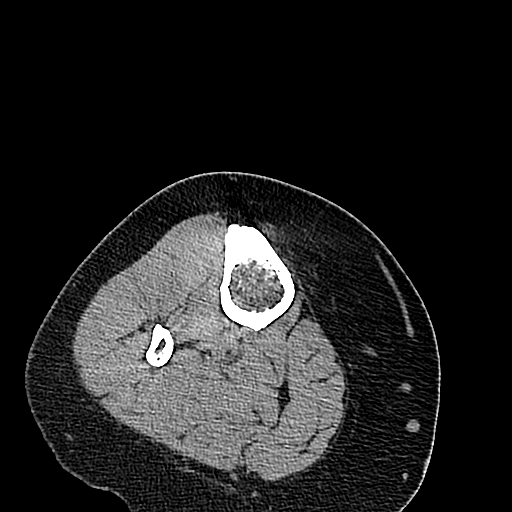
[im 62/308  bone]
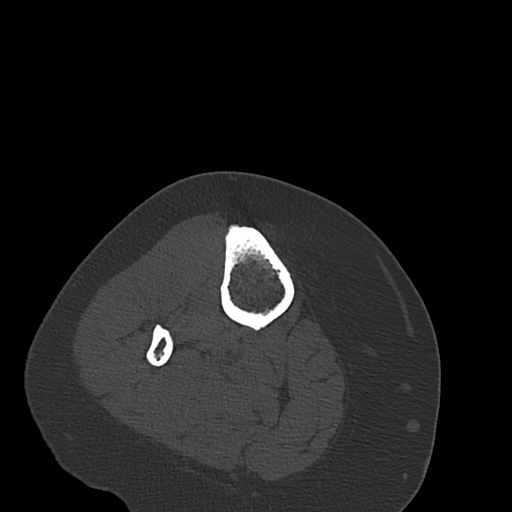
[im 123/308  bone]
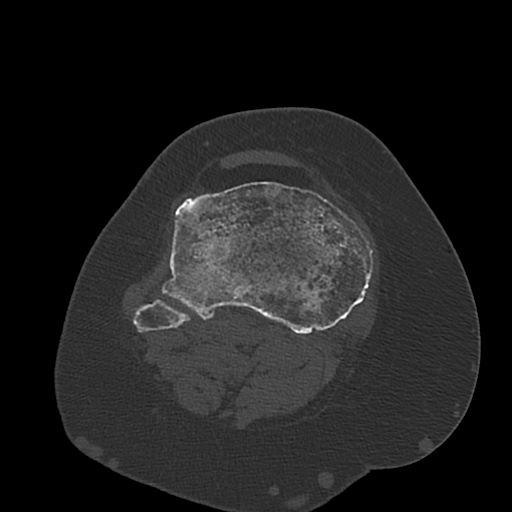
[im 185/308  bone]
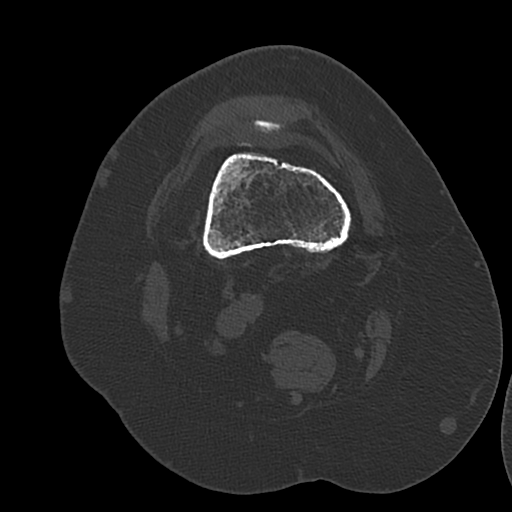
[im 246/308  bone]
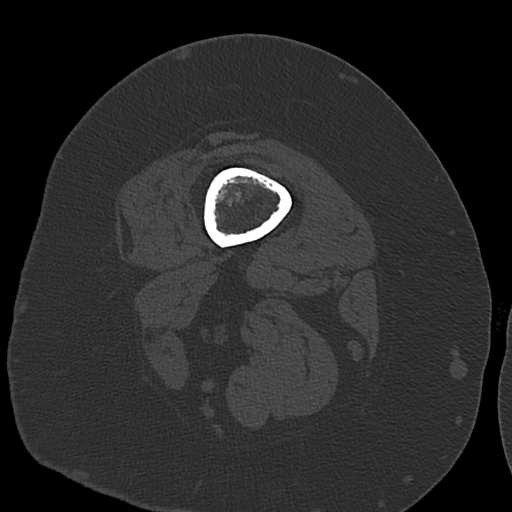

[Series 5: coronal bone knee · coronal · 0.30mm/px · 1 of 75 slices shown]
[im 38/75  bone]
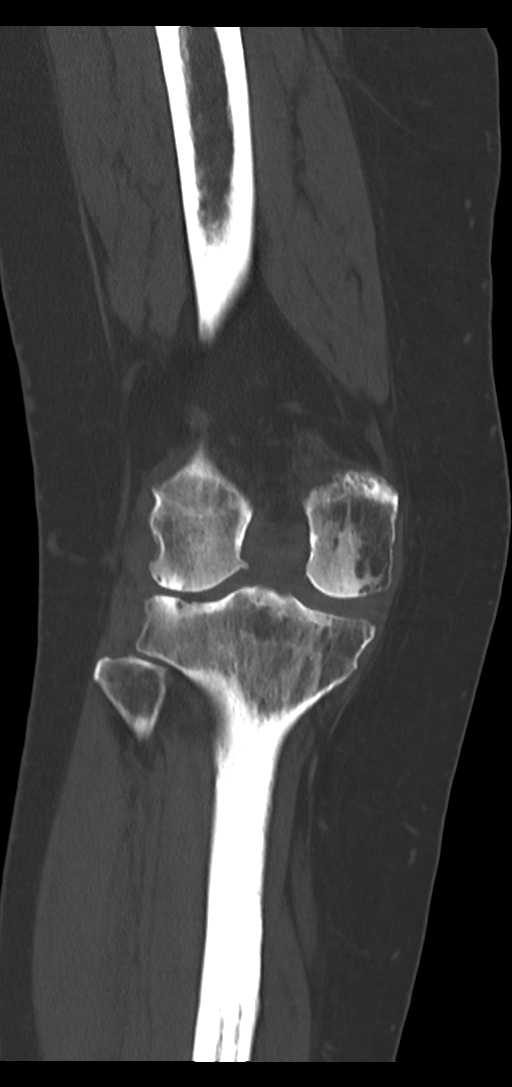

[Series 9: sagittal bone knee · sagittal · 0.30mm/px · 5 of 77 slices shown]
[im 13/77  bone]
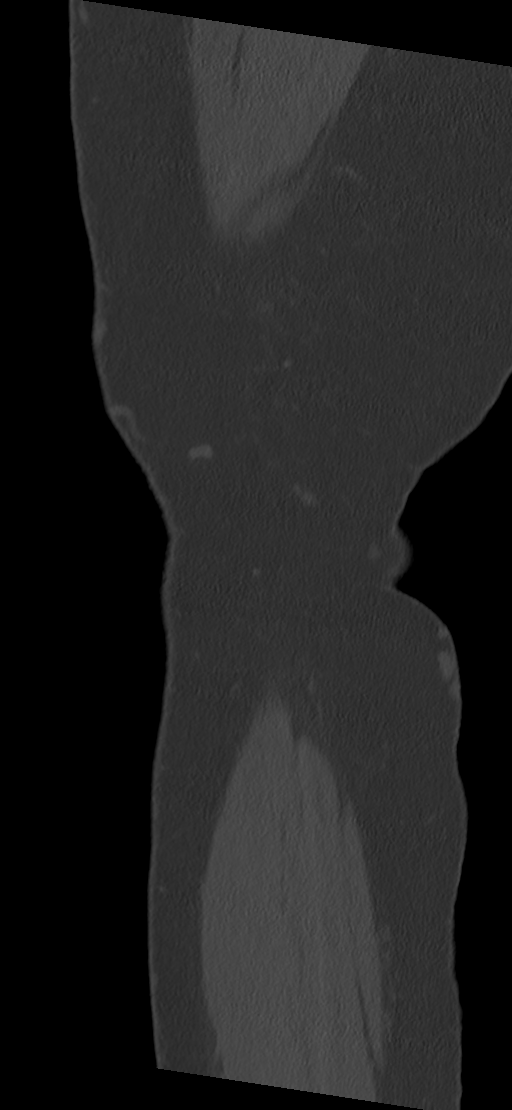
[im 26/77  bone]
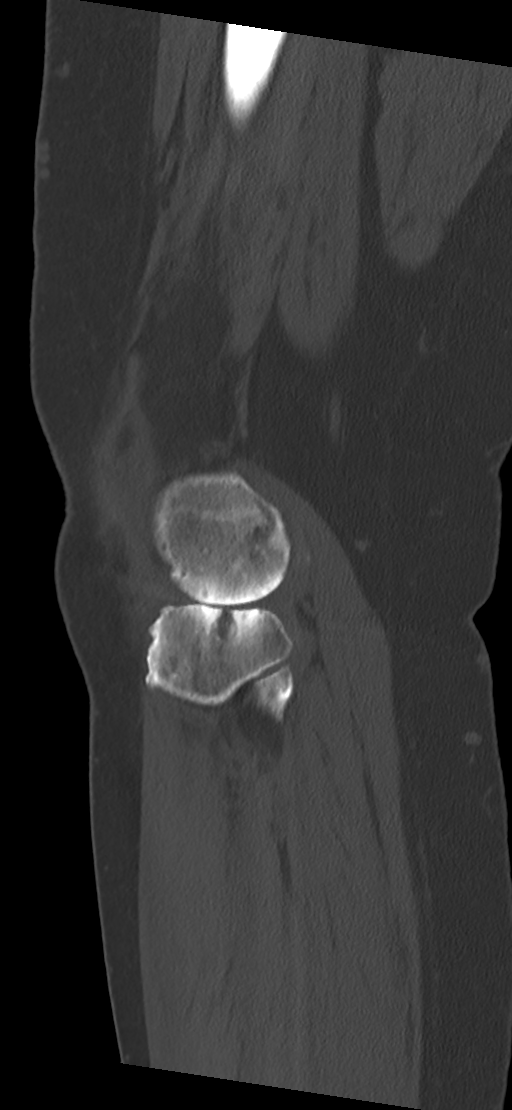
[im 39/77  bone]
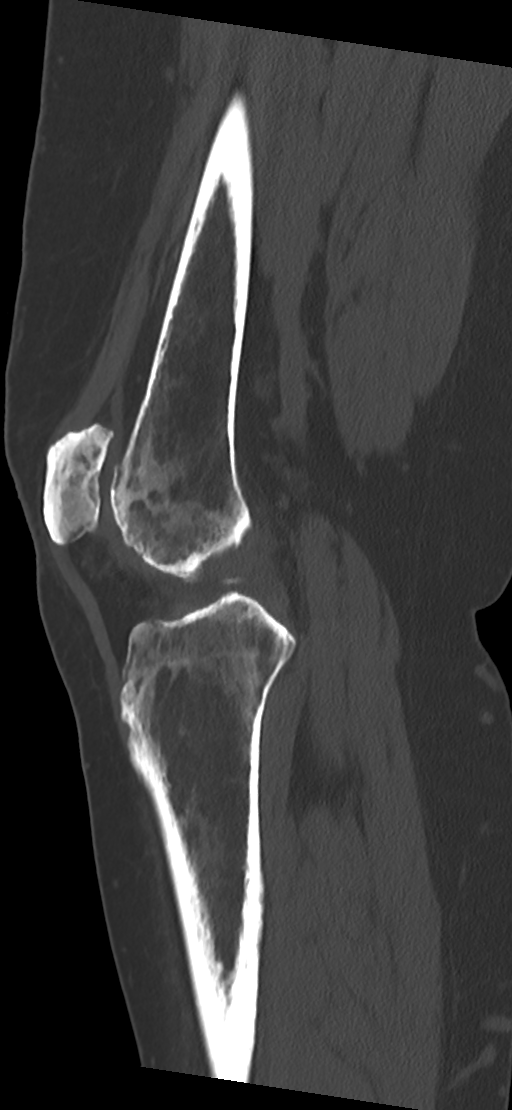
[im 51/77  bone]
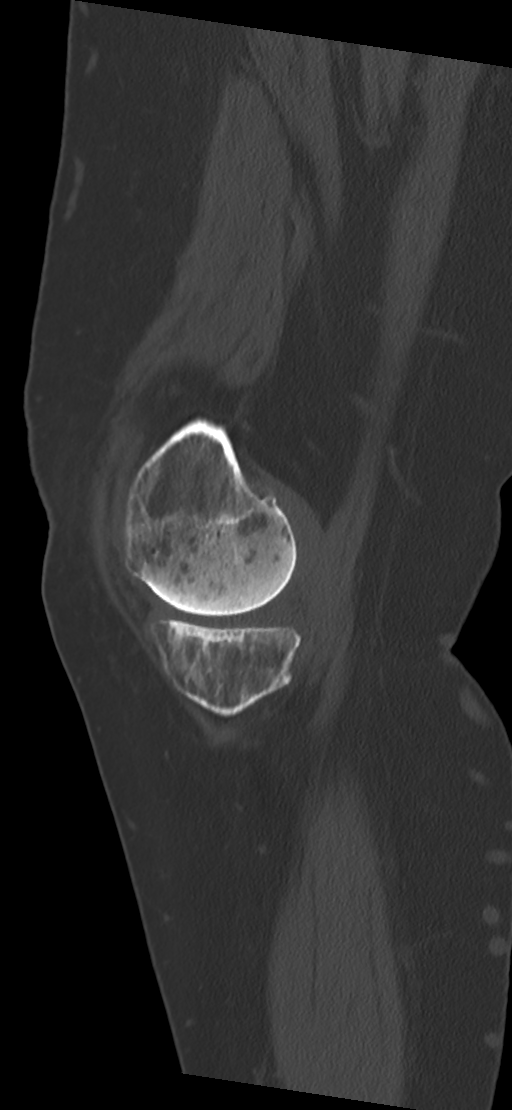
[im 64/77  bone]
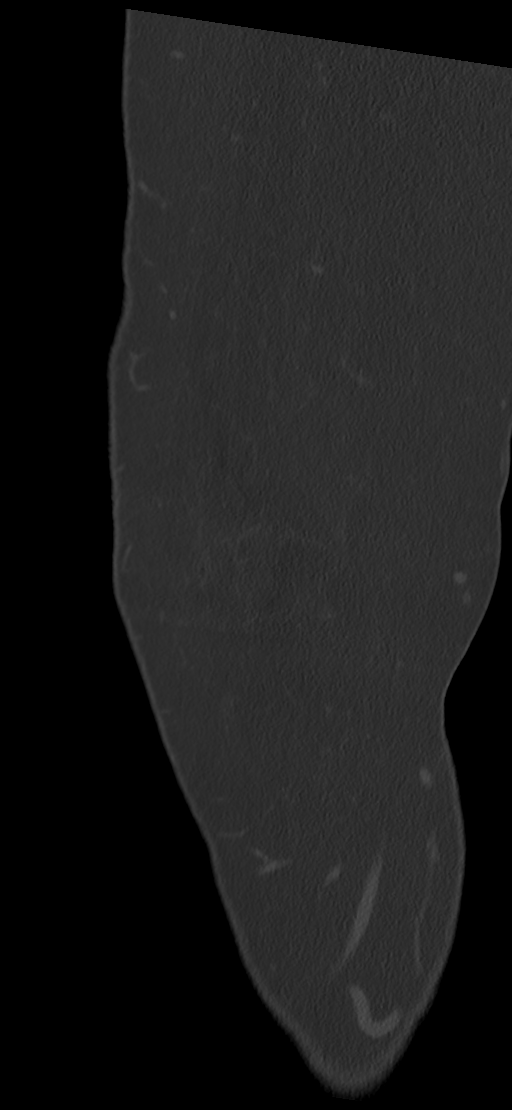

[10 of 33 positions shown; findings below may reference images not displayed]

FINDINGS: Bones/Joint/Cartilage

The bones appear mildly demineralized, and there are scattered
periarticular lucencies which measure fat density. No evidence of
acute fracture or dislocation. In the lateral compartment, there is
advanced joint space narrowing with subchondral sclerosis, cyst
formation and osteophytes. There is mild joint space narrowing and
osteophyte formation in the medial and patellofemoral compartments.
There is a small joint effusion with a linear ossified loose body
laterally in the suprapatellar bursa.

Ligaments

Suboptimally assessed by CT.  The cruciate ligaments appear intact.

Muscles and Tendons

Unremarkable. There is no muscular atrophy. The extensor mechanism
is intact.

Soft tissues

There are scattered superficial varicosities. There is mild
femoropopliteal atherosclerosis. A small Baker's cyst is present
containing a small ossified loose body. No inflammatory changes are
evident.

This protocol includes axial images through the right hip and right
ankle for surgical planning. There are minimal degenerative changes
at the right hip. No significant findings are seen at the ankle.
IMPRESSION: 1. Imaging for preoperative planning.
2. Tricompartmental degenerative changes, most advanced in the
lateral compartment. Small intra-articular loose bodies noted.
3. No acute findings.

## 2019-11-02 IMAGING — DX DG KNEE 1-2V*R*
2 series · 2 of 2 positions shown · non-contrast
Comparison: Right knee films of 08/24/2014

CLINICAL DATA: Postop right knee replacement

EXAM:
RIGHT KNEE - 1-2 VIEW

[knee ap]
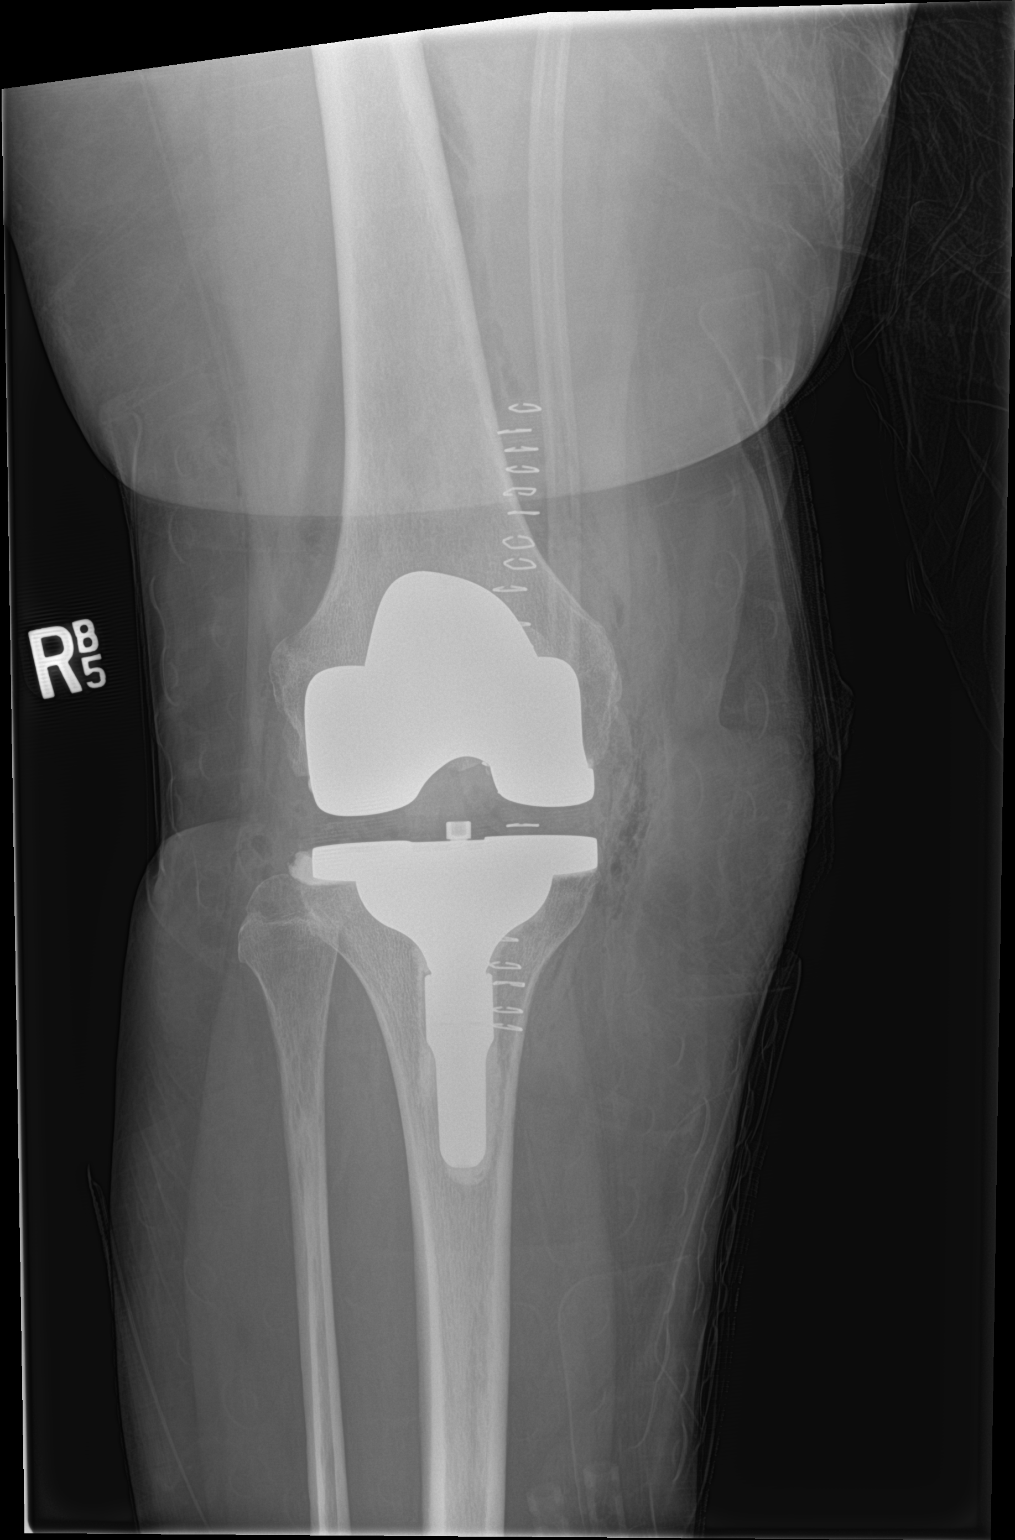

[knee lat]
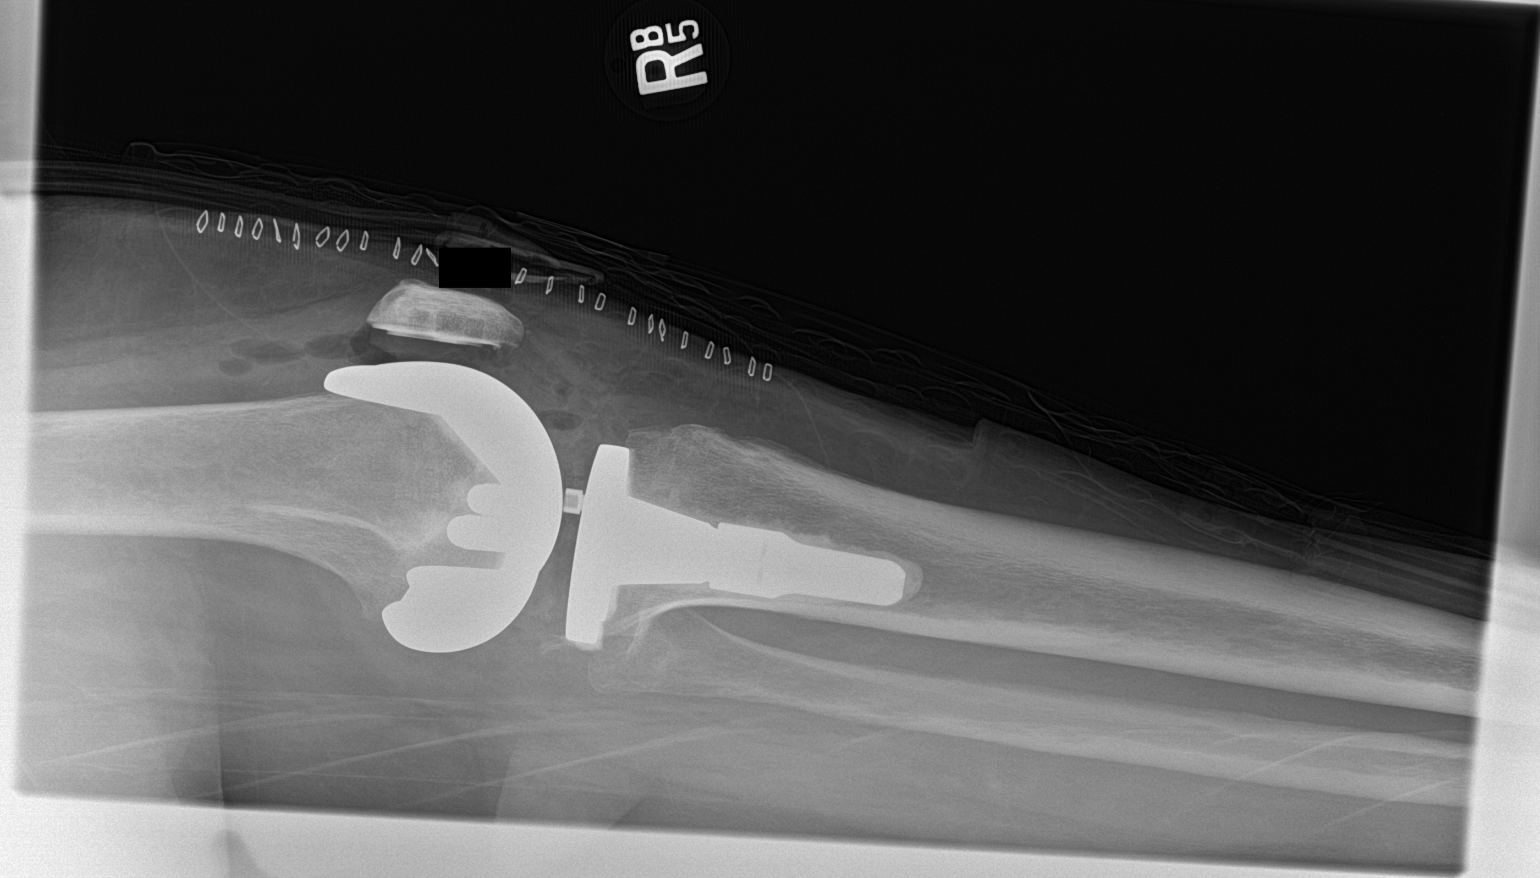

[2 of 2 positions shown; findings below may reference images not displayed]

FINDINGS: The femoral and tibial components of the right total knee
replacement appear to be in good position and normally aligned. No
complicating features are seen. Some air is noted in the soft
tissues and joint space postoperatively. Soft tissue swelling is
noted medially.
IMPRESSION: Right total knee replacement components in good position with no
complicating features noted.
